# Patient Record
Sex: Female | Born: 1950
Health system: Southern US, Community
[De-identification: ages and names within clinical notes are randomized; demographics above are authoritative.]

## PROBLEM LIST (undated history)

## (undated) DIAGNOSIS — R570 Cardiogenic shock: Secondary | ICD-10-CM

## (undated) DIAGNOSIS — J45909 Unspecified asthma, uncomplicated: Secondary | ICD-10-CM

## (undated) DIAGNOSIS — R001 Bradycardia, unspecified: Secondary | ICD-10-CM

## (undated) DIAGNOSIS — K7200 Acute and subacute hepatic failure without coma: Secondary | ICD-10-CM

## (undated) DIAGNOSIS — K219 Gastro-esophageal reflux disease without esophagitis: Secondary | ICD-10-CM

## (undated) DIAGNOSIS — M479 Spondylosis, unspecified: Secondary | ICD-10-CM

## (undated) DIAGNOSIS — T8859XA Other complications of anesthesia, initial encounter: Secondary | ICD-10-CM

## (undated) DIAGNOSIS — I471 Supraventricular tachycardia: Secondary | ICD-10-CM

## (undated) DIAGNOSIS — E119 Type 2 diabetes mellitus without complications: Secondary | ICD-10-CM

## (undated) DIAGNOSIS — E559 Vitamin D deficiency, unspecified: Secondary | ICD-10-CM

## (undated) DIAGNOSIS — E785 Hyperlipidemia, unspecified: Secondary | ICD-10-CM

## (undated) DIAGNOSIS — I700 Atherosclerosis of aorta: Secondary | ICD-10-CM

## (undated) DIAGNOSIS — E874 Mixed disorder of acid-base balance: Secondary | ICD-10-CM

## (undated) DIAGNOSIS — J9601 Acute respiratory failure with hypoxia: Secondary | ICD-10-CM

## (undated) DIAGNOSIS — I10 Essential (primary) hypertension: Secondary | ICD-10-CM

## (undated) HISTORY — DX: Bradycardia, unspecified: R00.1

## (undated) HISTORY — DX: Acute and subacute hepatic failure without coma: K72.00

## (undated) HISTORY — DX: Vitamin D deficiency, unspecified: E55.9

## (undated) HISTORY — DX: Unspecified asthma, uncomplicated: J45.909

## (undated) HISTORY — PX: KNEE SURGERY: SHX244

## (undated) HISTORY — DX: Cardiogenic shock: R57.0

## (undated) HISTORY — DX: Mixed disorder of acid-base balance: E87.4

## (undated) HISTORY — DX: Atherosclerosis of aorta: I70.0

## (undated) HISTORY — PX: ABDOMINAL HYSTERECTOMY: SHX81

## (undated) HISTORY — DX: Gastro-esophageal reflux disease without esophagitis: K21.9

## (undated) HISTORY — DX: Type 2 diabetes mellitus without complications: E11.9

## (undated) HISTORY — DX: Hypercalcemia: E83.52

## (undated) HISTORY — DX: Acute respiratory failure with hypoxia: J96.01

## (undated) HISTORY — DX: Spondylosis, unspecified: M47.9

## (undated) HISTORY — DX: Hyperlipidemia, unspecified: E78.5

## (undated) HISTORY — DX: Supraventricular tachycardia: I47.1

## (undated) HISTORY — PX: INSERT / REPLACE / REMOVE PACEMAKER: SUR710

## (undated) HISTORY — DX: Essential (primary) hypertension: I10

---

## 2008-02-22 ENCOUNTER — Ambulatory Visit (HOSPITAL_BASED_OUTPATIENT_CLINIC_OR_DEPARTMENT_OTHER): Admission: RE | Admit: 2008-02-22 | Discharge: 2008-02-22 | Payer: Self-pay | Admitting: Internal Medicine

## 2008-02-29 ENCOUNTER — Ambulatory Visit: Payer: Self-pay | Admitting: Internal Medicine

## 2011-04-13 NOTE — Procedures (Signed)
NAME:  Cathy Walker, Cathy Walker               ACCOUNT NO.:  [id number]   MEDICAL RECORD NO.:  [id number]          PATIENT TYPE:  OUT   LOCATION:  SLEEP CENTER                 FACILITY:  [facility]   PHYSICIAN:  Clinton D. Maple Hudson, MD, FCCP, FACPDATE OF BIRTH:  Mar 30[birth year] 1949   DATE OF STUDY:                            NOCTURNAL POLYSOMNOGRAM   REFERRING PHYSICIAN:  Rayfield Citizen Prochnau   INDICATION FOR STUDY:  Hypersomnia with sleep apnea.   EPWORTH SLEEPINESS SCORE:  12/24.   BMI 37.8.  Weight 200 pounds.  Height 61 inches.  Neck 16 inches.   HOME MEDICATIONS:  Charted and reviewed.  Bedtime medications included  Carvedilol, metformin, Lopid, Cozaar, Tessalon.   SLEEP ARCHITECTURE:  Split-study protocol.  During the diagnostic phase,  total sleep time was 123.5 minutes with sleep efficiency 52%.  Stage I  was 24.7%.  Stage II 74.1%.  Stage III was absent.  REM was 1.2% of  total sleep time.  Sleep latency 46 minutes.  REM latency 190 minutes.  Awake after sleep onset 69 minutes.  Arousal index 38.4 which is  increased.   RESPIRATORY DATA:  Split-study protocol.  Apnea hypopnea index (AHI)  19.9 events per hour indicating moderate obstructive sleep  apnea/hypopnea syndrome for CPAP.  This included a total of 41 events  including 4 obstructive apneas and 37 hypopneas.  CPAP was titrated to  14 CWP, AHI 0 per hour.  She chose a medium Mirage Quattro mask with  heated humidifier.   OXYGEN DATA:  Very loud snoring before CPAP with oxygen desaturation to  a nadir of 89%.  After CPAP control, oxygen saturation held 94.9% on  room air.   CARDIAC DATA:  Normal sinus rhythm.   MOVEMENT-PARASOMNIA:  Periodic limb movement with arousal increased to  17 per hour during CPAP fitting.   IMPRESSIONS-RECOMMENDATIONS:  1. Moderate obstructive sleep apnea/hypopnea syndrome, AHI 19.9 per      hour including a small number of central apneas, very loud snoring      with oxygen desaturation to a nadir of 89%.  2.  Successful CPAP control at a pressure of 14 CWP.  She chose a      medium Mirage Quattro mask with heated humidifier.      Clinton D. Maple Hudson, MD, FCCP, FACP  Diplomate, Biomedical engineer of Sleep Medicine  Electronically Signed     CDY/MEDQ  D:  02/28/2008 13:47:05  T:  02/28/2008 14:55:02  Job:  [id number]

## 2013-09-16 ENCOUNTER — Encounter: Payer: Self-pay | Admitting: Internal Medicine

## 2013-11-06 ENCOUNTER — Ambulatory Visit: Payer: Self-pay | Admitting: Internal Medicine

## 2013-12-18 ENCOUNTER — Ambulatory Visit: Payer: Self-pay | Admitting: Internal Medicine

## 2014-08-11 DIAGNOSIS — Z79899 Other long term (current) drug therapy: Secondary | ICD-10-CM | POA: Diagnosis not present

## 2014-08-11 DIAGNOSIS — D649 Anemia, unspecified: Secondary | ICD-10-CM | POA: Diagnosis not present

## 2014-09-15 DIAGNOSIS — K219 Gastro-esophageal reflux disease without esophagitis: Secondary | ICD-10-CM | POA: Diagnosis not present

## 2014-09-15 DIAGNOSIS — E1165 Type 2 diabetes mellitus with hyperglycemia: Secondary | ICD-10-CM | POA: Diagnosis not present

## 2014-09-15 DIAGNOSIS — I10 Essential (primary) hypertension: Secondary | ICD-10-CM | POA: Diagnosis not present

## 2014-09-15 DIAGNOSIS — E785 Hyperlipidemia, unspecified: Secondary | ICD-10-CM | POA: Diagnosis not present

## 2014-09-15 DIAGNOSIS — Z79899 Other long term (current) drug therapy: Secondary | ICD-10-CM | POA: Diagnosis not present

## 2014-09-15 DIAGNOSIS — D649 Anemia, unspecified: Secondary | ICD-10-CM | POA: Diagnosis not present

## 2014-09-15 DIAGNOSIS — G43009 Migraine without aura, not intractable, without status migrainosus: Secondary | ICD-10-CM | POA: Diagnosis not present

## 2014-09-30 DIAGNOSIS — G43009 Migraine without aura, not intractable, without status migrainosus: Secondary | ICD-10-CM | POA: Diagnosis not present

## 2014-11-04 DIAGNOSIS — N952 Postmenopausal atrophic vaginitis: Secondary | ICD-10-CM | POA: Diagnosis not present

## 2014-11-04 DIAGNOSIS — B009 Herpesviral infection, unspecified: Secondary | ICD-10-CM | POA: Diagnosis not present

## 2014-12-31 DIAGNOSIS — D72819 Decreased white blood cell count, unspecified: Secondary | ICD-10-CM | POA: Diagnosis not present

## 2014-12-31 DIAGNOSIS — E785 Hyperlipidemia, unspecified: Secondary | ICD-10-CM | POA: Diagnosis not present

## 2014-12-31 DIAGNOSIS — I10 Essential (primary) hypertension: Secondary | ICD-10-CM | POA: Diagnosis not present

## 2014-12-31 DIAGNOSIS — E1165 Type 2 diabetes mellitus with hyperglycemia: Secondary | ICD-10-CM | POA: Diagnosis not present

## 2014-12-31 DIAGNOSIS — Z79899 Other long term (current) drug therapy: Secondary | ICD-10-CM | POA: Diagnosis not present

## 2015-01-31 DIAGNOSIS — N289 Disorder of kidney and ureter, unspecified: Secondary | ICD-10-CM | POA: Diagnosis not present

## 2015-03-07 DIAGNOSIS — D649 Anemia, unspecified: Secondary | ICD-10-CM | POA: Diagnosis not present

## 2015-03-07 DIAGNOSIS — R799 Abnormal finding of blood chemistry, unspecified: Secondary | ICD-10-CM | POA: Diagnosis not present

## 2015-05-06 DIAGNOSIS — E539 Vitamin B deficiency, unspecified: Secondary | ICD-10-CM | POA: Diagnosis not present

## 2015-05-06 DIAGNOSIS — E559 Vitamin D deficiency, unspecified: Secondary | ICD-10-CM | POA: Diagnosis not present

## 2015-05-06 DIAGNOSIS — J329 Chronic sinusitis, unspecified: Secondary | ICD-10-CM | POA: Diagnosis not present

## 2015-05-06 DIAGNOSIS — E785 Hyperlipidemia, unspecified: Secondary | ICD-10-CM | POA: Diagnosis not present

## 2015-05-06 DIAGNOSIS — J45909 Unspecified asthma, uncomplicated: Secondary | ICD-10-CM | POA: Diagnosis not present

## 2015-05-06 DIAGNOSIS — G43909 Migraine, unspecified, not intractable, without status migrainosus: Secondary | ICD-10-CM | POA: Diagnosis not present

## 2015-05-06 DIAGNOSIS — Z79899 Other long term (current) drug therapy: Secondary | ICD-10-CM | POA: Diagnosis not present

## 2015-05-06 DIAGNOSIS — E1165 Type 2 diabetes mellitus with hyperglycemia: Secondary | ICD-10-CM | POA: Diagnosis not present

## 2015-05-06 DIAGNOSIS — I10 Essential (primary) hypertension: Secondary | ICD-10-CM | POA: Diagnosis not present

## 2015-06-07 DIAGNOSIS — G43009 Migraine without aura, not intractable, without status migrainosus: Secondary | ICD-10-CM | POA: Diagnosis not present

## 2015-06-07 DIAGNOSIS — G603 Idiopathic progressive neuropathy: Secondary | ICD-10-CM | POA: Diagnosis not present

## 2015-06-07 DIAGNOSIS — G4733 Obstructive sleep apnea (adult) (pediatric): Secondary | ICD-10-CM | POA: Diagnosis not present

## 2015-06-07 DIAGNOSIS — G2581 Restless legs syndrome: Secondary | ICD-10-CM | POA: Diagnosis not present

## 2015-06-28 DIAGNOSIS — G43909 Migraine, unspecified, not intractable, without status migrainosus: Secondary | ICD-10-CM | POA: Diagnosis not present

## 2015-08-08 DIAGNOSIS — I10 Essential (primary) hypertension: Secondary | ICD-10-CM | POA: Diagnosis not present

## 2015-08-08 DIAGNOSIS — E785 Hyperlipidemia, unspecified: Secondary | ICD-10-CM | POA: Diagnosis not present

## 2015-08-08 DIAGNOSIS — J45909 Unspecified asthma, uncomplicated: Secondary | ICD-10-CM

## 2015-08-08 DIAGNOSIS — E559 Vitamin D deficiency, unspecified: Secondary | ICD-10-CM | POA: Diagnosis not present

## 2015-08-08 DIAGNOSIS — E1165 Type 2 diabetes mellitus with hyperglycemia: Secondary | ICD-10-CM | POA: Diagnosis not present

## 2015-08-08 DIAGNOSIS — R05 Cough: Secondary | ICD-10-CM | POA: Diagnosis not present

## 2015-08-08 DIAGNOSIS — Z79899 Other long term (current) drug therapy: Secondary | ICD-10-CM | POA: Diagnosis not present

## 2015-08-08 DIAGNOSIS — D649 Anemia, unspecified: Secondary | ICD-10-CM | POA: Diagnosis not present

## 2015-08-08 DIAGNOSIS — F332 Major depressive disorder, recurrent severe without psychotic features: Secondary | ICD-10-CM | POA: Diagnosis not present

## 2015-08-08 HISTORY — DX: Unspecified asthma, uncomplicated: J45.909

## 2015-10-05 DIAGNOSIS — G43009 Migraine without aura, not intractable, without status migrainosus: Secondary | ICD-10-CM | POA: Diagnosis not present

## 2015-10-05 DIAGNOSIS — E785 Hyperlipidemia, unspecified: Secondary | ICD-10-CM | POA: Diagnosis not present

## 2015-10-05 DIAGNOSIS — E1165 Type 2 diabetes mellitus with hyperglycemia: Secondary | ICD-10-CM | POA: Diagnosis not present

## 2015-10-05 DIAGNOSIS — I10 Essential (primary) hypertension: Secondary | ICD-10-CM | POA: Diagnosis not present

## 2016-01-09 DIAGNOSIS — R05 Cough: Secondary | ICD-10-CM | POA: Diagnosis not present

## 2016-01-09 DIAGNOSIS — E1165 Type 2 diabetes mellitus with hyperglycemia: Secondary | ICD-10-CM | POA: Diagnosis not present

## 2016-01-09 DIAGNOSIS — Z79899 Other long term (current) drug therapy: Secondary | ICD-10-CM | POA: Diagnosis not present

## 2016-01-09 DIAGNOSIS — J45909 Unspecified asthma, uncomplicated: Secondary | ICD-10-CM | POA: Diagnosis not present

## 2016-01-09 DIAGNOSIS — E785 Hyperlipidemia, unspecified: Secondary | ICD-10-CM | POA: Diagnosis not present

## 2016-01-09 DIAGNOSIS — G43009 Migraine without aura, not intractable, without status migrainosus: Secondary | ICD-10-CM | POA: Diagnosis not present

## 2016-01-09 DIAGNOSIS — I10 Essential (primary) hypertension: Secondary | ICD-10-CM | POA: Diagnosis not present

## 2016-01-11 DIAGNOSIS — E1165 Type 2 diabetes mellitus with hyperglycemia: Secondary | ICD-10-CM | POA: Diagnosis not present

## 2016-05-09 DIAGNOSIS — E559 Vitamin D deficiency, unspecified: Secondary | ICD-10-CM | POA: Diagnosis not present

## 2016-05-09 DIAGNOSIS — M79644 Pain in right finger(s): Secondary | ICD-10-CM | POA: Diagnosis not present

## 2016-05-09 DIAGNOSIS — E785 Hyperlipidemia, unspecified: Secondary | ICD-10-CM | POA: Diagnosis not present

## 2016-05-09 DIAGNOSIS — J45909 Unspecified asthma, uncomplicated: Secondary | ICD-10-CM | POA: Diagnosis not present

## 2016-05-09 DIAGNOSIS — Z79899 Other long term (current) drug therapy: Secondary | ICD-10-CM | POA: Diagnosis not present

## 2016-05-09 DIAGNOSIS — I10 Essential (primary) hypertension: Secondary | ICD-10-CM | POA: Diagnosis not present

## 2016-05-09 DIAGNOSIS — E1165 Type 2 diabetes mellitus with hyperglycemia: Secondary | ICD-10-CM | POA: Diagnosis not present

## 2016-05-09 HISTORY — DX: Vitamin D deficiency, unspecified: E55.9

## 2016-05-24 DIAGNOSIS — M79644 Pain in right finger(s): Secondary | ICD-10-CM | POA: Diagnosis not present

## 2016-07-02 DIAGNOSIS — Z1389 Encounter for screening for other disorder: Secondary | ICD-10-CM | POA: Diagnosis not present

## 2016-07-02 DIAGNOSIS — Z0000 Encounter for general adult medical examination without abnormal findings: Secondary | ICD-10-CM | POA: Diagnosis not present

## 2016-07-02 DIAGNOSIS — Z1231 Encounter for screening mammogram for malignant neoplasm of breast: Secondary | ICD-10-CM | POA: Diagnosis not present

## 2016-07-03 DIAGNOSIS — H524 Presbyopia: Secondary | ICD-10-CM | POA: Diagnosis not present

## 2016-07-03 DIAGNOSIS — H04552 Acquired stenosis of left nasolacrimal duct: Secondary | ICD-10-CM | POA: Diagnosis not present

## 2016-07-03 DIAGNOSIS — H2513 Age-related nuclear cataract, bilateral: Secondary | ICD-10-CM | POA: Diagnosis not present

## 2016-08-14 DIAGNOSIS — R1032 Left lower quadrant pain: Secondary | ICD-10-CM | POA: Diagnosis not present

## 2016-08-14 DIAGNOSIS — R062 Wheezing: Secondary | ICD-10-CM | POA: Diagnosis not present

## 2016-08-14 DIAGNOSIS — J40 Bronchitis, not specified as acute or chronic: Secondary | ICD-10-CM | POA: Diagnosis not present

## 2016-08-14 DIAGNOSIS — R05 Cough: Secondary | ICD-10-CM | POA: Diagnosis not present

## 2016-09-25 DIAGNOSIS — Z79899 Other long term (current) drug therapy: Secondary | ICD-10-CM | POA: Diagnosis not present

## 2016-09-25 DIAGNOSIS — G43009 Migraine without aura, not intractable, without status migrainosus: Secondary | ICD-10-CM | POA: Diagnosis not present

## 2016-09-25 DIAGNOSIS — E785 Hyperlipidemia, unspecified: Secondary | ICD-10-CM | POA: Diagnosis not present

## 2016-09-25 DIAGNOSIS — E119 Type 2 diabetes mellitus without complications: Secondary | ICD-10-CM | POA: Diagnosis not present

## 2016-09-25 DIAGNOSIS — J45909 Unspecified asthma, uncomplicated: Secondary | ICD-10-CM | POA: Diagnosis not present

## 2016-09-25 DIAGNOSIS — E559 Vitamin D deficiency, unspecified: Secondary | ICD-10-CM | POA: Diagnosis not present

## 2016-09-25 DIAGNOSIS — I10 Essential (primary) hypertension: Secondary | ICD-10-CM | POA: Diagnosis not present

## 2016-09-25 HISTORY — DX: Hypercalcemia: E83.52

## 2016-10-08 DIAGNOSIS — R1032 Left lower quadrant pain: Secondary | ICD-10-CM | POA: Diagnosis not present

## 2016-10-08 DIAGNOSIS — K5730 Diverticulosis of large intestine without perforation or abscess without bleeding: Secondary | ICD-10-CM | POA: Diagnosis not present

## 2016-10-08 DIAGNOSIS — K5732 Diverticulitis of large intestine without perforation or abscess without bleeding: Secondary | ICD-10-CM | POA: Diagnosis not present

## 2016-12-25 DIAGNOSIS — Z23 Encounter for immunization: Secondary | ICD-10-CM | POA: Diagnosis not present

## 2017-01-24 DIAGNOSIS — M25561 Pain in right knee: Secondary | ICD-10-CM | POA: Diagnosis not present

## 2017-01-24 DIAGNOSIS — J45909 Unspecified asthma, uncomplicated: Secondary | ICD-10-CM | POA: Diagnosis not present

## 2017-01-24 DIAGNOSIS — E785 Hyperlipidemia, unspecified: Secondary | ICD-10-CM | POA: Diagnosis not present

## 2017-01-24 DIAGNOSIS — I10 Essential (primary) hypertension: Secondary | ICD-10-CM | POA: Diagnosis not present

## 2017-01-24 DIAGNOSIS — Z79899 Other long term (current) drug therapy: Secondary | ICD-10-CM | POA: Diagnosis not present

## 2017-01-24 DIAGNOSIS — E119 Type 2 diabetes mellitus without complications: Secondary | ICD-10-CM | POA: Diagnosis not present

## 2017-01-24 DIAGNOSIS — M1810 Unilateral primary osteoarthritis of first carpometacarpal joint, unspecified hand: Secondary | ICD-10-CM | POA: Diagnosis not present

## 2017-01-24 DIAGNOSIS — G43009 Migraine without aura, not intractable, without status migrainosus: Secondary | ICD-10-CM | POA: Diagnosis not present

## 2017-01-24 DIAGNOSIS — E559 Vitamin D deficiency, unspecified: Secondary | ICD-10-CM | POA: Diagnosis not present

## 2017-02-21 DIAGNOSIS — M545 Low back pain: Secondary | ICD-10-CM | POA: Diagnosis not present

## 2017-02-21 DIAGNOSIS — M546 Pain in thoracic spine: Secondary | ICD-10-CM | POA: Diagnosis not present

## 2017-02-21 DIAGNOSIS — M479 Spondylosis, unspecified: Secondary | ICD-10-CM

## 2017-02-21 DIAGNOSIS — M47819 Spondylosis without myelopathy or radiculopathy, site unspecified: Secondary | ICD-10-CM | POA: Diagnosis not present

## 2017-02-21 DIAGNOSIS — M25552 Pain in left hip: Secondary | ICD-10-CM | POA: Diagnosis not present

## 2017-02-21 DIAGNOSIS — R1032 Left lower quadrant pain: Secondary | ICD-10-CM | POA: Diagnosis not present

## 2017-02-21 HISTORY — DX: Spondylosis, unspecified: M47.9

## 2017-02-26 DIAGNOSIS — I700 Atherosclerosis of aorta: Secondary | ICD-10-CM

## 2017-02-26 HISTORY — DX: Atherosclerosis of aorta: I70.0

## 2017-03-02 DIAGNOSIS — M160 Bilateral primary osteoarthritis of hip: Secondary | ICD-10-CM | POA: Diagnosis not present

## 2017-03-02 DIAGNOSIS — M87052 Idiopathic aseptic necrosis of left femur: Secondary | ICD-10-CM | POA: Diagnosis not present

## 2017-03-06 DIAGNOSIS — I714 Abdominal aortic aneurysm, without rupture: Secondary | ICD-10-CM | POA: Diagnosis not present

## 2017-03-14 DIAGNOSIS — M479 Spondylosis, unspecified: Secondary | ICD-10-CM | POA: Diagnosis not present

## 2017-03-14 DIAGNOSIS — I700 Atherosclerosis of aorta: Secondary | ICD-10-CM | POA: Diagnosis not present

## 2017-03-14 DIAGNOSIS — K5730 Diverticulosis of large intestine without perforation or abscess without bleeding: Secondary | ICD-10-CM | POA: Diagnosis not present

## 2017-03-14 DIAGNOSIS — I77811 Abdominal aortic ectasia: Secondary | ICD-10-CM | POA: Diagnosis not present

## 2017-03-15 DIAGNOSIS — R591 Generalized enlarged lymph nodes: Secondary | ICD-10-CM | POA: Diagnosis not present

## 2017-03-15 DIAGNOSIS — E210 Primary hyperparathyroidism: Secondary | ICD-10-CM | POA: Diagnosis not present

## 2017-03-15 DIAGNOSIS — Z13820 Encounter for screening for osteoporosis: Secondary | ICD-10-CM | POA: Diagnosis not present

## 2017-03-21 DIAGNOSIS — E210 Primary hyperparathyroidism: Secondary | ICD-10-CM | POA: Diagnosis not present

## 2017-03-21 DIAGNOSIS — E042 Nontoxic multinodular goiter: Secondary | ICD-10-CM | POA: Diagnosis not present

## 2017-03-21 DIAGNOSIS — E213 Hyperparathyroidism, unspecified: Secondary | ICD-10-CM | POA: Diagnosis not present

## 2017-03-21 DIAGNOSIS — R591 Generalized enlarged lymph nodes: Secondary | ICD-10-CM | POA: Diagnosis not present

## 2017-03-21 DIAGNOSIS — E041 Nontoxic single thyroid nodule: Secondary | ICD-10-CM | POA: Diagnosis not present

## 2017-04-05 DIAGNOSIS — R790 Abnormal level of blood mineral: Secondary | ICD-10-CM | POA: Diagnosis not present

## 2017-04-30 DIAGNOSIS — Z13820 Encounter for screening for osteoporosis: Secondary | ICD-10-CM | POA: Diagnosis not present

## 2017-04-30 DIAGNOSIS — E210 Primary hyperparathyroidism: Secondary | ICD-10-CM | POA: Diagnosis not present

## 2017-05-13 DIAGNOSIS — M8588 Other specified disorders of bone density and structure, other site: Secondary | ICD-10-CM | POA: Diagnosis not present

## 2017-05-13 DIAGNOSIS — M85851 Other specified disorders of bone density and structure, right thigh: Secondary | ICD-10-CM | POA: Diagnosis not present

## 2017-05-28 DIAGNOSIS — J45909 Unspecified asthma, uncomplicated: Secondary | ICD-10-CM | POA: Diagnosis not present

## 2017-05-28 DIAGNOSIS — G43009 Migraine without aura, not intractable, without status migrainosus: Secondary | ICD-10-CM | POA: Diagnosis not present

## 2017-05-28 DIAGNOSIS — R790 Abnormal level of blood mineral: Secondary | ICD-10-CM | POA: Diagnosis not present

## 2017-05-28 DIAGNOSIS — R002 Palpitations: Secondary | ICD-10-CM | POA: Diagnosis not present

## 2017-05-28 DIAGNOSIS — E119 Type 2 diabetes mellitus without complications: Secondary | ICD-10-CM | POA: Diagnosis not present

## 2017-05-28 DIAGNOSIS — I10 Essential (primary) hypertension: Secondary | ICD-10-CM | POA: Diagnosis not present

## 2017-05-28 DIAGNOSIS — R0981 Nasal congestion: Secondary | ICD-10-CM | POA: Diagnosis not present

## 2017-05-28 DIAGNOSIS — E785 Hyperlipidemia, unspecified: Secondary | ICD-10-CM | POA: Diagnosis not present

## 2017-06-28 DIAGNOSIS — M25562 Pain in left knee: Secondary | ICD-10-CM | POA: Diagnosis not present

## 2017-06-28 DIAGNOSIS — F419 Anxiety disorder, unspecified: Secondary | ICD-10-CM | POA: Diagnosis not present

## 2017-06-28 DIAGNOSIS — I10 Essential (primary) hypertension: Secondary | ICD-10-CM | POA: Diagnosis not present

## 2017-06-28 DIAGNOSIS — J45909 Unspecified asthma, uncomplicated: Secondary | ICD-10-CM | POA: Diagnosis not present

## 2017-06-28 DIAGNOSIS — E1165 Type 2 diabetes mellitus with hyperglycemia: Secondary | ICD-10-CM | POA: Diagnosis not present

## 2017-06-28 DIAGNOSIS — G43009 Migraine without aura, not intractable, without status migrainosus: Secondary | ICD-10-CM | POA: Diagnosis not present

## 2017-06-28 DIAGNOSIS — E785 Hyperlipidemia, unspecified: Secondary | ICD-10-CM | POA: Diagnosis not present

## 2017-06-28 DIAGNOSIS — M47819 Spondylosis without myelopathy or radiculopathy, site unspecified: Secondary | ICD-10-CM | POA: Diagnosis not present

## 2017-07-01 DIAGNOSIS — M25562 Pain in left knee: Secondary | ICD-10-CM | POA: Diagnosis not present

## 2017-07-22 DIAGNOSIS — M25561 Pain in right knee: Secondary | ICD-10-CM | POA: Diagnosis not present

## 2017-07-22 DIAGNOSIS — S83412A Sprain of medial collateral ligament of left knee, initial encounter: Secondary | ICD-10-CM | POA: Diagnosis not present

## 2017-07-22 DIAGNOSIS — M25562 Pain in left knee: Secondary | ICD-10-CM | POA: Diagnosis not present

## 2017-08-15 DIAGNOSIS — M25462 Effusion, left knee: Secondary | ICD-10-CM | POA: Diagnosis not present

## 2017-08-15 DIAGNOSIS — X58XXXA Exposure to other specified factors, initial encounter: Secondary | ICD-10-CM | POA: Diagnosis not present

## 2017-08-15 DIAGNOSIS — M660 Rupture of popliteal cyst: Secondary | ICD-10-CM | POA: Diagnosis not present

## 2017-08-15 DIAGNOSIS — S83282A Other tear of lateral meniscus, current injury, left knee, initial encounter: Secondary | ICD-10-CM | POA: Diagnosis not present

## 2017-08-15 DIAGNOSIS — S83242A Other tear of medial meniscus, current injury, left knee, initial encounter: Secondary | ICD-10-CM | POA: Diagnosis not present

## 2017-08-15 DIAGNOSIS — M25562 Pain in left knee: Secondary | ICD-10-CM | POA: Diagnosis not present

## 2017-08-19 DIAGNOSIS — M1712 Unilateral primary osteoarthritis, left knee: Secondary | ICD-10-CM | POA: Diagnosis not present

## 2017-08-19 DIAGNOSIS — G8929 Other chronic pain: Secondary | ICD-10-CM | POA: Diagnosis not present

## 2017-08-19 DIAGNOSIS — M23301 Other meniscus derangements, unspecified lateral meniscus, left knee: Secondary | ICD-10-CM | POA: Diagnosis not present

## 2017-08-19 DIAGNOSIS — M23322 Other meniscus derangements, posterior horn of medial meniscus, left knee: Secondary | ICD-10-CM | POA: Diagnosis not present

## 2017-08-19 DIAGNOSIS — M25562 Pain in left knee: Secondary | ICD-10-CM | POA: Diagnosis not present

## 2017-08-20 DIAGNOSIS — Z79899 Other long term (current) drug therapy: Secondary | ICD-10-CM | POA: Diagnosis not present

## 2017-08-20 DIAGNOSIS — E785 Hyperlipidemia, unspecified: Secondary | ICD-10-CM | POA: Diagnosis not present

## 2017-08-20 DIAGNOSIS — E1165 Type 2 diabetes mellitus with hyperglycemia: Secondary | ICD-10-CM | POA: Diagnosis not present

## 2017-08-20 DIAGNOSIS — I10 Essential (primary) hypertension: Secondary | ICD-10-CM | POA: Diagnosis not present

## 2017-08-30 DIAGNOSIS — M23301 Other meniscus derangements, unspecified lateral meniscus, left knee: Secondary | ICD-10-CM | POA: Diagnosis not present

## 2017-08-30 DIAGNOSIS — G4730 Sleep apnea, unspecified: Secondary | ICD-10-CM | POA: Diagnosis not present

## 2017-08-30 DIAGNOSIS — M659 Synovitis and tenosynovitis, unspecified: Secondary | ICD-10-CM | POA: Diagnosis not present

## 2017-08-30 DIAGNOSIS — K219 Gastro-esophageal reflux disease without esophagitis: Secondary | ICD-10-CM | POA: Diagnosis not present

## 2017-08-30 DIAGNOSIS — M23322 Other meniscus derangements, posterior horn of medial meniscus, left knee: Secondary | ICD-10-CM | POA: Diagnosis not present

## 2017-08-30 DIAGNOSIS — M1712 Unilateral primary osteoarthritis, left knee: Secondary | ICD-10-CM | POA: Diagnosis not present

## 2017-08-30 DIAGNOSIS — I10 Essential (primary) hypertension: Secondary | ICD-10-CM | POA: Diagnosis not present

## 2017-08-30 DIAGNOSIS — H9190 Unspecified hearing loss, unspecified ear: Secondary | ICD-10-CM | POA: Diagnosis not present

## 2017-08-30 DIAGNOSIS — E785 Hyperlipidemia, unspecified: Secondary | ICD-10-CM | POA: Diagnosis not present

## 2017-08-30 DIAGNOSIS — Z87891 Personal history of nicotine dependence: Secondary | ICD-10-CM | POA: Diagnosis not present

## 2017-08-30 DIAGNOSIS — J45909 Unspecified asthma, uncomplicated: Secondary | ICD-10-CM | POA: Diagnosis not present

## 2017-08-30 DIAGNOSIS — G8929 Other chronic pain: Secondary | ICD-10-CM | POA: Diagnosis not present

## 2017-08-30 DIAGNOSIS — Z9989 Dependence on other enabling machines and devices: Secondary | ICD-10-CM | POA: Diagnosis not present

## 2017-08-30 DIAGNOSIS — M23352 Other meniscus derangements, posterior horn of lateral meniscus, left knee: Secondary | ICD-10-CM | POA: Diagnosis not present

## 2017-08-30 DIAGNOSIS — Z7984 Long term (current) use of oral hypoglycemic drugs: Secondary | ICD-10-CM | POA: Diagnosis not present

## 2017-08-30 DIAGNOSIS — E119 Type 2 diabetes mellitus without complications: Secondary | ICD-10-CM | POA: Diagnosis not present

## 2017-08-30 DIAGNOSIS — Z79899 Other long term (current) drug therapy: Secondary | ICD-10-CM | POA: Diagnosis not present

## 2017-09-02 DIAGNOSIS — R2689 Other abnormalities of gait and mobility: Secondary | ICD-10-CM | POA: Diagnosis not present

## 2017-09-02 DIAGNOSIS — M1712 Unilateral primary osteoarthritis, left knee: Secondary | ICD-10-CM | POA: Diagnosis not present

## 2017-09-02 DIAGNOSIS — M23301 Other meniscus derangements, unspecified lateral meniscus, left knee: Secondary | ICD-10-CM | POA: Diagnosis not present

## 2017-09-02 DIAGNOSIS — M23322 Other meniscus derangements, posterior horn of medial meniscus, left knee: Secondary | ICD-10-CM | POA: Diagnosis not present

## 2017-09-02 DIAGNOSIS — M6281 Muscle weakness (generalized): Secondary | ICD-10-CM | POA: Diagnosis not present

## 2017-09-02 DIAGNOSIS — M25562 Pain in left knee: Secondary | ICD-10-CM | POA: Diagnosis not present

## 2017-09-04 DIAGNOSIS — M25562 Pain in left knee: Secondary | ICD-10-CM | POA: Diagnosis not present

## 2017-09-04 DIAGNOSIS — M6281 Muscle weakness (generalized): Secondary | ICD-10-CM | POA: Diagnosis not present

## 2017-09-04 DIAGNOSIS — M23322 Other meniscus derangements, posterior horn of medial meniscus, left knee: Secondary | ICD-10-CM | POA: Diagnosis not present

## 2017-09-04 DIAGNOSIS — M23301 Other meniscus derangements, unspecified lateral meniscus, left knee: Secondary | ICD-10-CM | POA: Diagnosis not present

## 2017-09-04 DIAGNOSIS — M1712 Unilateral primary osteoarthritis, left knee: Secondary | ICD-10-CM | POA: Diagnosis not present

## 2017-09-04 DIAGNOSIS — R2689 Other abnormalities of gait and mobility: Secondary | ICD-10-CM | POA: Diagnosis not present

## 2017-09-09 DIAGNOSIS — M25562 Pain in left knee: Secondary | ICD-10-CM | POA: Diagnosis not present

## 2017-09-09 DIAGNOSIS — M6281 Muscle weakness (generalized): Secondary | ICD-10-CM | POA: Diagnosis not present

## 2017-09-09 DIAGNOSIS — R2689 Other abnormalities of gait and mobility: Secondary | ICD-10-CM | POA: Diagnosis not present

## 2017-09-09 DIAGNOSIS — M23301 Other meniscus derangements, unspecified lateral meniscus, left knee: Secondary | ICD-10-CM | POA: Diagnosis not present

## 2017-09-09 DIAGNOSIS — M1712 Unilateral primary osteoarthritis, left knee: Secondary | ICD-10-CM | POA: Diagnosis not present

## 2017-09-09 DIAGNOSIS — M23322 Other meniscus derangements, posterior horn of medial meniscus, left knee: Secondary | ICD-10-CM | POA: Diagnosis not present

## 2017-09-11 DIAGNOSIS — M23301 Other meniscus derangements, unspecified lateral meniscus, left knee: Secondary | ICD-10-CM | POA: Diagnosis not present

## 2017-09-11 DIAGNOSIS — R2689 Other abnormalities of gait and mobility: Secondary | ICD-10-CM | POA: Diagnosis not present

## 2017-09-11 DIAGNOSIS — M25562 Pain in left knee: Secondary | ICD-10-CM | POA: Diagnosis not present

## 2017-09-11 DIAGNOSIS — M1712 Unilateral primary osteoarthritis, left knee: Secondary | ICD-10-CM | POA: Diagnosis not present

## 2017-09-11 DIAGNOSIS — M6281 Muscle weakness (generalized): Secondary | ICD-10-CM | POA: Diagnosis not present

## 2017-09-11 DIAGNOSIS — M23322 Other meniscus derangements, posterior horn of medial meniscus, left knee: Secondary | ICD-10-CM | POA: Diagnosis not present

## 2017-09-16 DIAGNOSIS — R2689 Other abnormalities of gait and mobility: Secondary | ICD-10-CM | POA: Diagnosis not present

## 2017-09-16 DIAGNOSIS — M23301 Other meniscus derangements, unspecified lateral meniscus, left knee: Secondary | ICD-10-CM | POA: Diagnosis not present

## 2017-09-16 DIAGNOSIS — M23322 Other meniscus derangements, posterior horn of medial meniscus, left knee: Secondary | ICD-10-CM | POA: Diagnosis not present

## 2017-09-16 DIAGNOSIS — M1712 Unilateral primary osteoarthritis, left knee: Secondary | ICD-10-CM | POA: Diagnosis not present

## 2017-09-16 DIAGNOSIS — M25562 Pain in left knee: Secondary | ICD-10-CM | POA: Diagnosis not present

## 2017-09-16 DIAGNOSIS — M6281 Muscle weakness (generalized): Secondary | ICD-10-CM | POA: Diagnosis not present

## 2017-09-18 DIAGNOSIS — M1712 Unilateral primary osteoarthritis, left knee: Secondary | ICD-10-CM | POA: Diagnosis not present

## 2017-09-18 DIAGNOSIS — M23301 Other meniscus derangements, unspecified lateral meniscus, left knee: Secondary | ICD-10-CM | POA: Diagnosis not present

## 2017-09-18 DIAGNOSIS — M25562 Pain in left knee: Secondary | ICD-10-CM | POA: Diagnosis not present

## 2017-09-18 DIAGNOSIS — R2689 Other abnormalities of gait and mobility: Secondary | ICD-10-CM | POA: Diagnosis not present

## 2017-09-18 DIAGNOSIS — M23322 Other meniscus derangements, posterior horn of medial meniscus, left knee: Secondary | ICD-10-CM | POA: Diagnosis not present

## 2017-09-18 DIAGNOSIS — M6281 Muscle weakness (generalized): Secondary | ICD-10-CM | POA: Diagnosis not present

## 2017-09-23 DIAGNOSIS — M23301 Other meniscus derangements, unspecified lateral meniscus, left knee: Secondary | ICD-10-CM | POA: Diagnosis not present

## 2017-09-23 DIAGNOSIS — M1712 Unilateral primary osteoarthritis, left knee: Secondary | ICD-10-CM | POA: Diagnosis not present

## 2017-09-23 DIAGNOSIS — M6281 Muscle weakness (generalized): Secondary | ICD-10-CM | POA: Diagnosis not present

## 2017-09-23 DIAGNOSIS — M25562 Pain in left knee: Secondary | ICD-10-CM | POA: Diagnosis not present

## 2017-09-23 DIAGNOSIS — M23322 Other meniscus derangements, posterior horn of medial meniscus, left knee: Secondary | ICD-10-CM | POA: Diagnosis not present

## 2017-09-23 DIAGNOSIS — R2689 Other abnormalities of gait and mobility: Secondary | ICD-10-CM | POA: Diagnosis not present

## 2017-09-25 DIAGNOSIS — M25562 Pain in left knee: Secondary | ICD-10-CM | POA: Diagnosis not present

## 2017-09-25 DIAGNOSIS — R2689 Other abnormalities of gait and mobility: Secondary | ICD-10-CM | POA: Diagnosis not present

## 2017-09-25 DIAGNOSIS — M1712 Unilateral primary osteoarthritis, left knee: Secondary | ICD-10-CM | POA: Diagnosis not present

## 2017-09-25 DIAGNOSIS — M6281 Muscle weakness (generalized): Secondary | ICD-10-CM | POA: Diagnosis not present

## 2017-09-25 DIAGNOSIS — M23322 Other meniscus derangements, posterior horn of medial meniscus, left knee: Secondary | ICD-10-CM | POA: Diagnosis not present

## 2017-09-25 DIAGNOSIS — M23301 Other meniscus derangements, unspecified lateral meniscus, left knee: Secondary | ICD-10-CM | POA: Diagnosis not present

## 2017-10-01 DIAGNOSIS — M1712 Unilateral primary osteoarthritis, left knee: Secondary | ICD-10-CM | POA: Diagnosis not present

## 2017-10-01 DIAGNOSIS — M23301 Other meniscus derangements, unspecified lateral meniscus, left knee: Secondary | ICD-10-CM | POA: Diagnosis not present

## 2017-10-01 DIAGNOSIS — E119 Type 2 diabetes mellitus without complications: Secondary | ICD-10-CM | POA: Diagnosis not present

## 2017-10-01 DIAGNOSIS — E559 Vitamin D deficiency, unspecified: Secondary | ICD-10-CM | POA: Diagnosis not present

## 2017-10-01 DIAGNOSIS — M23322 Other meniscus derangements, posterior horn of medial meniscus, left knee: Secondary | ICD-10-CM | POA: Diagnosis not present

## 2017-10-01 DIAGNOSIS — M25562 Pain in left knee: Secondary | ICD-10-CM | POA: Diagnosis not present

## 2017-10-01 DIAGNOSIS — I10 Essential (primary) hypertension: Secondary | ICD-10-CM | POA: Diagnosis not present

## 2017-10-01 DIAGNOSIS — M7989 Other specified soft tissue disorders: Secondary | ICD-10-CM | POA: Diagnosis not present

## 2017-10-01 DIAGNOSIS — Z79899 Other long term (current) drug therapy: Secondary | ICD-10-CM | POA: Diagnosis not present

## 2017-10-01 DIAGNOSIS — E785 Hyperlipidemia, unspecified: Secondary | ICD-10-CM | POA: Diagnosis not present

## 2017-10-01 DIAGNOSIS — R2689 Other abnormalities of gait and mobility: Secondary | ICD-10-CM | POA: Diagnosis not present

## 2017-10-01 DIAGNOSIS — M6281 Muscle weakness (generalized): Secondary | ICD-10-CM | POA: Diagnosis not present

## 2017-10-07 DIAGNOSIS — M1712 Unilateral primary osteoarthritis, left knee: Secondary | ICD-10-CM | POA: Diagnosis not present

## 2017-10-07 DIAGNOSIS — M23322 Other meniscus derangements, posterior horn of medial meniscus, left knee: Secondary | ICD-10-CM | POA: Diagnosis not present

## 2017-10-07 DIAGNOSIS — M23301 Other meniscus derangements, unspecified lateral meniscus, left knee: Secondary | ICD-10-CM | POA: Diagnosis not present

## 2017-10-07 DIAGNOSIS — M25562 Pain in left knee: Secondary | ICD-10-CM | POA: Diagnosis not present

## 2017-10-07 DIAGNOSIS — R2689 Other abnormalities of gait and mobility: Secondary | ICD-10-CM | POA: Diagnosis not present

## 2017-10-07 DIAGNOSIS — M6281 Muscle weakness (generalized): Secondary | ICD-10-CM | POA: Diagnosis not present

## 2017-10-09 DIAGNOSIS — M25562 Pain in left knee: Secondary | ICD-10-CM | POA: Diagnosis not present

## 2017-10-09 DIAGNOSIS — M6281 Muscle weakness (generalized): Secondary | ICD-10-CM | POA: Diagnosis not present

## 2017-10-09 DIAGNOSIS — M23301 Other meniscus derangements, unspecified lateral meniscus, left knee: Secondary | ICD-10-CM | POA: Diagnosis not present

## 2017-10-09 DIAGNOSIS — R2689 Other abnormalities of gait and mobility: Secondary | ICD-10-CM | POA: Diagnosis not present

## 2017-10-09 DIAGNOSIS — M23322 Other meniscus derangements, posterior horn of medial meniscus, left knee: Secondary | ICD-10-CM | POA: Diagnosis not present

## 2017-10-09 DIAGNOSIS — M1712 Unilateral primary osteoarthritis, left knee: Secondary | ICD-10-CM | POA: Diagnosis not present

## 2017-10-14 DIAGNOSIS — M23301 Other meniscus derangements, unspecified lateral meniscus, left knee: Secondary | ICD-10-CM | POA: Diagnosis not present

## 2017-10-14 DIAGNOSIS — M25562 Pain in left knee: Secondary | ICD-10-CM | POA: Diagnosis not present

## 2017-10-14 DIAGNOSIS — M23322 Other meniscus derangements, posterior horn of medial meniscus, left knee: Secondary | ICD-10-CM | POA: Diagnosis not present

## 2017-10-14 DIAGNOSIS — M1712 Unilateral primary osteoarthritis, left knee: Secondary | ICD-10-CM | POA: Diagnosis not present

## 2017-10-14 DIAGNOSIS — R2689 Other abnormalities of gait and mobility: Secondary | ICD-10-CM | POA: Diagnosis not present

## 2017-10-14 DIAGNOSIS — M6281 Muscle weakness (generalized): Secondary | ICD-10-CM | POA: Diagnosis not present

## 2017-10-24 DIAGNOSIS — M23322 Other meniscus derangements, posterior horn of medial meniscus, left knee: Secondary | ICD-10-CM | POA: Diagnosis not present

## 2017-10-24 DIAGNOSIS — M1712 Unilateral primary osteoarthritis, left knee: Secondary | ICD-10-CM | POA: Diagnosis not present

## 2017-10-24 DIAGNOSIS — R2689 Other abnormalities of gait and mobility: Secondary | ICD-10-CM | POA: Diagnosis not present

## 2017-10-24 DIAGNOSIS — M23301 Other meniscus derangements, unspecified lateral meniscus, left knee: Secondary | ICD-10-CM | POA: Diagnosis not present

## 2017-10-24 DIAGNOSIS — M6281 Muscle weakness (generalized): Secondary | ICD-10-CM | POA: Diagnosis not present

## 2017-10-24 DIAGNOSIS — M25562 Pain in left knee: Secondary | ICD-10-CM | POA: Diagnosis not present

## 2017-10-27 DIAGNOSIS — Z23 Encounter for immunization: Secondary | ICD-10-CM | POA: Diagnosis not present

## 2017-10-28 DIAGNOSIS — M23301 Other meniscus derangements, unspecified lateral meniscus, left knee: Secondary | ICD-10-CM | POA: Diagnosis not present

## 2017-10-28 DIAGNOSIS — M6281 Muscle weakness (generalized): Secondary | ICD-10-CM | POA: Diagnosis not present

## 2017-10-28 DIAGNOSIS — M23322 Other meniscus derangements, posterior horn of medial meniscus, left knee: Secondary | ICD-10-CM | POA: Diagnosis not present

## 2017-10-28 DIAGNOSIS — M25562 Pain in left knee: Secondary | ICD-10-CM | POA: Diagnosis not present

## 2017-10-28 DIAGNOSIS — M1712 Unilateral primary osteoarthritis, left knee: Secondary | ICD-10-CM | POA: Diagnosis not present

## 2017-10-28 DIAGNOSIS — R2689 Other abnormalities of gait and mobility: Secondary | ICD-10-CM | POA: Diagnosis not present

## 2017-10-30 DIAGNOSIS — R2689 Other abnormalities of gait and mobility: Secondary | ICD-10-CM | POA: Diagnosis not present

## 2017-10-30 DIAGNOSIS — M25562 Pain in left knee: Secondary | ICD-10-CM | POA: Diagnosis not present

## 2017-10-30 DIAGNOSIS — M6281 Muscle weakness (generalized): Secondary | ICD-10-CM | POA: Diagnosis not present

## 2017-10-30 DIAGNOSIS — M23322 Other meniscus derangements, posterior horn of medial meniscus, left knee: Secondary | ICD-10-CM | POA: Diagnosis not present

## 2017-10-30 DIAGNOSIS — M23301 Other meniscus derangements, unspecified lateral meniscus, left knee: Secondary | ICD-10-CM | POA: Diagnosis not present

## 2017-10-30 DIAGNOSIS — M1712 Unilateral primary osteoarthritis, left knee: Secondary | ICD-10-CM | POA: Diagnosis not present

## 2017-11-06 DIAGNOSIS — M23301 Other meniscus derangements, unspecified lateral meniscus, left knee: Secondary | ICD-10-CM | POA: Diagnosis not present

## 2017-11-06 DIAGNOSIS — M1712 Unilateral primary osteoarthritis, left knee: Secondary | ICD-10-CM | POA: Diagnosis not present

## 2017-11-06 DIAGNOSIS — M6281 Muscle weakness (generalized): Secondary | ICD-10-CM | POA: Diagnosis not present

## 2017-11-06 DIAGNOSIS — M25562 Pain in left knee: Secondary | ICD-10-CM | POA: Diagnosis not present

## 2017-11-06 DIAGNOSIS — M23322 Other meniscus derangements, posterior horn of medial meniscus, left knee: Secondary | ICD-10-CM | POA: Diagnosis not present

## 2017-11-06 DIAGNOSIS — R2689 Other abnormalities of gait and mobility: Secondary | ICD-10-CM | POA: Diagnosis not present

## 2017-11-11 DIAGNOSIS — M1712 Unilateral primary osteoarthritis, left knee: Secondary | ICD-10-CM | POA: Diagnosis not present

## 2017-11-11 DIAGNOSIS — R2689 Other abnormalities of gait and mobility: Secondary | ICD-10-CM | POA: Diagnosis not present

## 2017-11-11 DIAGNOSIS — M23322 Other meniscus derangements, posterior horn of medial meniscus, left knee: Secondary | ICD-10-CM | POA: Diagnosis not present

## 2017-11-11 DIAGNOSIS — M25562 Pain in left knee: Secondary | ICD-10-CM | POA: Diagnosis not present

## 2017-11-11 DIAGNOSIS — M23301 Other meniscus derangements, unspecified lateral meniscus, left knee: Secondary | ICD-10-CM | POA: Diagnosis not present

## 2017-11-11 DIAGNOSIS — M6281 Muscle weakness (generalized): Secondary | ICD-10-CM | POA: Diagnosis not present

## 2017-11-13 DIAGNOSIS — M1712 Unilateral primary osteoarthritis, left knee: Secondary | ICD-10-CM | POA: Diagnosis not present

## 2017-11-13 DIAGNOSIS — M23322 Other meniscus derangements, posterior horn of medial meniscus, left knee: Secondary | ICD-10-CM | POA: Diagnosis not present

## 2017-11-13 DIAGNOSIS — M23301 Other meniscus derangements, unspecified lateral meniscus, left knee: Secondary | ICD-10-CM | POA: Diagnosis not present

## 2017-11-13 DIAGNOSIS — R2689 Other abnormalities of gait and mobility: Secondary | ICD-10-CM | POA: Diagnosis not present

## 2017-11-13 DIAGNOSIS — M25562 Pain in left knee: Secondary | ICD-10-CM | POA: Diagnosis not present

## 2017-11-13 DIAGNOSIS — M6281 Muscle weakness (generalized): Secondary | ICD-10-CM | POA: Diagnosis not present

## 2017-11-27 DIAGNOSIS — I10 Essential (primary) hypertension: Secondary | ICD-10-CM | POA: Diagnosis not present

## 2017-11-27 DIAGNOSIS — R05 Cough: Secondary | ICD-10-CM | POA: Diagnosis not present

## 2017-11-27 DIAGNOSIS — R0981 Nasal congestion: Secondary | ICD-10-CM | POA: Diagnosis not present

## 2017-11-27 DIAGNOSIS — G43009 Migraine without aura, not intractable, without status migrainosus: Secondary | ICD-10-CM | POA: Diagnosis not present

## 2017-12-19 DIAGNOSIS — R790 Abnormal level of blood mineral: Secondary | ICD-10-CM | POA: Diagnosis not present

## 2017-12-25 DIAGNOSIS — N2889 Other specified disorders of kidney and ureter: Secondary | ICD-10-CM | POA: Diagnosis not present

## 2017-12-25 DIAGNOSIS — I10 Essential (primary) hypertension: Secondary | ICD-10-CM | POA: Diagnosis not present

## 2018-01-02 DIAGNOSIS — F419 Anxiety disorder, unspecified: Secondary | ICD-10-CM | POA: Diagnosis not present

## 2018-01-02 DIAGNOSIS — J45909 Unspecified asthma, uncomplicated: Secondary | ICD-10-CM | POA: Diagnosis not present

## 2018-01-02 DIAGNOSIS — E119 Type 2 diabetes mellitus without complications: Secondary | ICD-10-CM | POA: Diagnosis not present

## 2018-01-02 DIAGNOSIS — I10 Essential (primary) hypertension: Secondary | ICD-10-CM | POA: Diagnosis not present

## 2018-01-02 DIAGNOSIS — G43009 Migraine without aura, not intractable, without status migrainosus: Secondary | ICD-10-CM | POA: Diagnosis not present

## 2018-01-02 DIAGNOSIS — E559 Vitamin D deficiency, unspecified: Secondary | ICD-10-CM | POA: Diagnosis not present

## 2018-01-02 DIAGNOSIS — Z79899 Other long term (current) drug therapy: Secondary | ICD-10-CM | POA: Diagnosis not present

## 2018-01-02 DIAGNOSIS — E785 Hyperlipidemia, unspecified: Secondary | ICD-10-CM | POA: Diagnosis not present

## 2018-02-17 DIAGNOSIS — Z1389 Encounter for screening for other disorder: Secondary | ICD-10-CM | POA: Diagnosis not present

## 2018-02-17 DIAGNOSIS — Z1331 Encounter for screening for depression: Secondary | ICD-10-CM | POA: Diagnosis not present

## 2018-02-17 DIAGNOSIS — Z0001 Encounter for general adult medical examination with abnormal findings: Secondary | ICD-10-CM | POA: Diagnosis not present

## 2018-02-17 DIAGNOSIS — I10 Essential (primary) hypertension: Secondary | ICD-10-CM | POA: Diagnosis not present

## 2018-02-17 DIAGNOSIS — M25562 Pain in left knee: Secondary | ICD-10-CM | POA: Diagnosis not present

## 2018-02-17 DIAGNOSIS — E1169 Type 2 diabetes mellitus with other specified complication: Secondary | ICD-10-CM | POA: Diagnosis not present

## 2018-02-17 DIAGNOSIS — Z23 Encounter for immunization: Secondary | ICD-10-CM | POA: Diagnosis not present

## 2018-03-31 DIAGNOSIS — E785 Hyperlipidemia, unspecified: Secondary | ICD-10-CM | POA: Diagnosis not present

## 2018-03-31 DIAGNOSIS — Z79899 Other long term (current) drug therapy: Secondary | ICD-10-CM | POA: Diagnosis not present

## 2018-03-31 DIAGNOSIS — E1165 Type 2 diabetes mellitus with hyperglycemia: Secondary | ICD-10-CM | POA: Diagnosis not present

## 2018-04-05 DIAGNOSIS — E119 Type 2 diabetes mellitus without complications: Secondary | ICD-10-CM | POA: Diagnosis present

## 2018-04-05 DIAGNOSIS — J9601 Acute respiratory failure with hypoxia: Secondary | ICD-10-CM | POA: Diagnosis not present

## 2018-04-05 DIAGNOSIS — M1990 Unspecified osteoarthritis, unspecified site: Secondary | ICD-10-CM | POA: Diagnosis present

## 2018-04-05 DIAGNOSIS — J45909 Unspecified asthma, uncomplicated: Secondary | ICD-10-CM | POA: Diagnosis present

## 2018-04-05 DIAGNOSIS — J90 Pleural effusion, not elsewhere classified: Secondary | ICD-10-CM | POA: Diagnosis not present

## 2018-04-05 DIAGNOSIS — T461X5A Adverse effect of calcium-channel blockers, initial encounter: Secondary | ICD-10-CM | POA: Diagnosis present

## 2018-04-05 DIAGNOSIS — R0602 Shortness of breath: Secondary | ICD-10-CM | POA: Diagnosis not present

## 2018-04-05 DIAGNOSIS — R5381 Other malaise: Secondary | ICD-10-CM | POA: Diagnosis not present

## 2018-04-05 DIAGNOSIS — T447X5A Adverse effect of beta-adrenoreceptor antagonists, initial encounter: Secondary | ICD-10-CM | POA: Diagnosis present

## 2018-04-05 DIAGNOSIS — R42 Dizziness and giddiness: Secondary | ICD-10-CM | POA: Diagnosis not present

## 2018-04-05 DIAGNOSIS — R570 Cardiogenic shock: Secondary | ICD-10-CM | POA: Diagnosis not present

## 2018-04-05 DIAGNOSIS — Z9911 Dependence on respirator [ventilator] status: Secondary | ICD-10-CM | POA: Diagnosis not present

## 2018-04-05 DIAGNOSIS — I4891 Unspecified atrial fibrillation: Secondary | ICD-10-CM | POA: Diagnosis present

## 2018-04-05 DIAGNOSIS — K7200 Acute and subacute hepatic failure without coma: Secondary | ICD-10-CM | POA: Diagnosis not present

## 2018-04-05 DIAGNOSIS — J9811 Atelectasis: Secondary | ICD-10-CM | POA: Diagnosis not present

## 2018-04-05 DIAGNOSIS — R55 Syncope and collapse: Secondary | ICD-10-CM | POA: Diagnosis not present

## 2018-04-05 DIAGNOSIS — Z90710 Acquired absence of both cervix and uterus: Secondary | ICD-10-CM | POA: Diagnosis not present

## 2018-04-05 DIAGNOSIS — E118 Type 2 diabetes mellitus with unspecified complications: Secondary | ICD-10-CM | POA: Diagnosis not present

## 2018-04-05 DIAGNOSIS — Z794 Long term (current) use of insulin: Secondary | ICD-10-CM | POA: Diagnosis not present

## 2018-04-05 DIAGNOSIS — Z79899 Other long term (current) drug therapy: Secondary | ICD-10-CM | POA: Diagnosis not present

## 2018-04-05 DIAGNOSIS — I9589 Other hypotension: Secondary | ICD-10-CM | POA: Diagnosis not present

## 2018-04-05 DIAGNOSIS — Z7984 Long term (current) use of oral hypoglycemic drugs: Secondary | ICD-10-CM | POA: Diagnosis not present

## 2018-04-05 DIAGNOSIS — K219 Gastro-esophageal reflux disease without esophagitis: Secondary | ICD-10-CM | POA: Diagnosis present

## 2018-04-05 DIAGNOSIS — N179 Acute kidney failure, unspecified: Secondary | ICD-10-CM | POA: Diagnosis not present

## 2018-04-05 DIAGNOSIS — J9602 Acute respiratory failure with hypercapnia: Secondary | ICD-10-CM | POA: Diagnosis present

## 2018-04-05 DIAGNOSIS — E7800 Pure hypercholesterolemia, unspecified: Secondary | ICD-10-CM | POA: Diagnosis present

## 2018-04-05 DIAGNOSIS — I071 Rheumatic tricuspid insufficiency: Secondary | ICD-10-CM | POA: Diagnosis present

## 2018-04-05 DIAGNOSIS — Z4682 Encounter for fitting and adjustment of non-vascular catheter: Secondary | ICD-10-CM | POA: Diagnosis not present

## 2018-04-05 DIAGNOSIS — I361 Nonrheumatic tricuspid (valve) insufficiency: Secondary | ICD-10-CM | POA: Diagnosis not present

## 2018-04-05 DIAGNOSIS — E874 Mixed disorder of acid-base balance: Secondary | ICD-10-CM | POA: Diagnosis not present

## 2018-04-05 DIAGNOSIS — R001 Bradycardia, unspecified: Secondary | ICD-10-CM | POA: Diagnosis not present

## 2018-04-05 DIAGNOSIS — I10 Essential (primary) hypertension: Secondary | ICD-10-CM | POA: Diagnosis present

## 2018-04-05 DIAGNOSIS — I959 Hypotension, unspecified: Secondary | ICD-10-CM | POA: Diagnosis not present

## 2018-04-07 DIAGNOSIS — E119 Type 2 diabetes mellitus without complications: Secondary | ICD-10-CM

## 2018-04-07 DIAGNOSIS — K219 Gastro-esophageal reflux disease without esophagitis: Secondary | ICD-10-CM

## 2018-04-07 DIAGNOSIS — E785 Hyperlipidemia, unspecified: Secondary | ICD-10-CM | POA: Insufficient documentation

## 2018-04-07 HISTORY — DX: Hyperlipidemia, unspecified: E78.5

## 2018-04-07 HISTORY — DX: Type 2 diabetes mellitus without complications: E11.9

## 2018-04-07 HISTORY — DX: Gastro-esophageal reflux disease without esophagitis: K21.9

## 2018-04-14 DIAGNOSIS — R001 Bradycardia, unspecified: Secondary | ICD-10-CM | POA: Diagnosis not present

## 2018-04-14 DIAGNOSIS — I10 Essential (primary) hypertension: Secondary | ICD-10-CM | POA: Diagnosis not present

## 2018-04-14 DIAGNOSIS — L03311 Cellulitis of abdominal wall: Secondary | ICD-10-CM | POA: Diagnosis not present

## 2018-04-18 DIAGNOSIS — R079 Chest pain, unspecified: Secondary | ICD-10-CM | POA: Diagnosis not present

## 2018-04-18 DIAGNOSIS — R001 Bradycardia, unspecified: Secondary | ICD-10-CM

## 2018-04-18 DIAGNOSIS — I10 Essential (primary) hypertension: Secondary | ICD-10-CM

## 2018-04-18 HISTORY — DX: Bradycardia, unspecified: R00.1

## 2018-04-18 HISTORY — DX: Essential (primary) hypertension: I10

## 2018-04-21 DIAGNOSIS — R000 Tachycardia, unspecified: Secondary | ICD-10-CM | POA: Diagnosis not present

## 2018-04-21 DIAGNOSIS — R9431 Abnormal electrocardiogram [ECG] [EKG]: Secondary | ICD-10-CM | POA: Diagnosis not present

## 2018-04-22 DIAGNOSIS — E1169 Type 2 diabetes mellitus with other specified complication: Secondary | ICD-10-CM | POA: Diagnosis not present

## 2018-04-22 DIAGNOSIS — I10 Essential (primary) hypertension: Secondary | ICD-10-CM | POA: Diagnosis not present

## 2018-04-22 DIAGNOSIS — R079 Chest pain, unspecified: Secondary | ICD-10-CM | POA: Diagnosis not present

## 2018-04-29 DIAGNOSIS — R079 Chest pain, unspecified: Secondary | ICD-10-CM | POA: Diagnosis not present

## 2018-05-02 DIAGNOSIS — R079 Chest pain, unspecified: Secondary | ICD-10-CM | POA: Diagnosis not present

## 2018-05-05 DIAGNOSIS — R05 Cough: Secondary | ICD-10-CM | POA: Diagnosis not present

## 2018-05-05 DIAGNOSIS — M542 Cervicalgia: Secondary | ICD-10-CM | POA: Diagnosis not present

## 2018-05-05 DIAGNOSIS — E1169 Type 2 diabetes mellitus with other specified complication: Secondary | ICD-10-CM | POA: Diagnosis not present

## 2018-05-05 DIAGNOSIS — R600 Localized edema: Secondary | ICD-10-CM | POA: Diagnosis not present

## 2018-05-05 DIAGNOSIS — J45909 Unspecified asthma, uncomplicated: Secondary | ICD-10-CM | POA: Diagnosis not present

## 2018-05-05 DIAGNOSIS — R079 Chest pain, unspecified: Secondary | ICD-10-CM | POA: Diagnosis not present

## 2018-05-05 DIAGNOSIS — R002 Palpitations: Secondary | ICD-10-CM | POA: Diagnosis not present

## 2018-05-05 DIAGNOSIS — I10 Essential (primary) hypertension: Secondary | ICD-10-CM | POA: Diagnosis not present

## 2018-06-03 DIAGNOSIS — I471 Supraventricular tachycardia, unspecified: Secondary | ICD-10-CM

## 2018-06-03 HISTORY — DX: Supraventricular tachycardia: I47.1

## 2018-06-03 HISTORY — DX: Supraventricular tachycardia, unspecified: I47.10

## 2018-06-23 DIAGNOSIS — J45909 Unspecified asthma, uncomplicated: Secondary | ICD-10-CM | POA: Diagnosis not present

## 2018-06-23 DIAGNOSIS — I10 Essential (primary) hypertension: Secondary | ICD-10-CM | POA: Diagnosis not present

## 2018-06-23 DIAGNOSIS — E1169 Type 2 diabetes mellitus with other specified complication: Secondary | ICD-10-CM | POA: Diagnosis not present

## 2018-06-23 DIAGNOSIS — R05 Cough: Secondary | ICD-10-CM | POA: Diagnosis not present

## 2018-06-23 DIAGNOSIS — E785 Hyperlipidemia, unspecified: Secondary | ICD-10-CM | POA: Diagnosis not present

## 2018-06-23 DIAGNOSIS — G43009 Migraine without aura, not intractable, without status migrainosus: Secondary | ICD-10-CM | POA: Diagnosis not present

## 2018-06-24 ENCOUNTER — Other Ambulatory Visit: Payer: Self-pay | Admitting: *Deleted

## 2018-06-24 ENCOUNTER — Encounter: Payer: Self-pay | Admitting: *Deleted

## 2018-06-24 DIAGNOSIS — E874 Mixed disorder of acid-base balance: Secondary | ICD-10-CM

## 2018-06-24 DIAGNOSIS — J9601 Acute respiratory failure with hypoxia: Secondary | ICD-10-CM

## 2018-06-24 DIAGNOSIS — R570 Cardiogenic shock: Secondary | ICD-10-CM

## 2018-06-24 DIAGNOSIS — K7200 Acute and subacute hepatic failure without coma: Secondary | ICD-10-CM

## 2018-06-24 HISTORY — DX: Acute and subacute hepatic failure without coma: K72.00

## 2018-06-24 HISTORY — DX: Mixed disorder of acid-base balance: E87.4

## 2018-06-24 HISTORY — DX: Cardiogenic shock: R57.0

## 2018-06-24 HISTORY — DX: Acute respiratory failure with hypoxia: J96.01

## 2018-06-26 ENCOUNTER — Other Ambulatory Visit: Payer: Self-pay

## 2018-06-27 DIAGNOSIS — Z7984 Long term (current) use of oral hypoglycemic drugs: Secondary | ICD-10-CM | POA: Diagnosis not present

## 2018-06-27 DIAGNOSIS — I10 Essential (primary) hypertension: Secondary | ICD-10-CM | POA: Diagnosis not present

## 2018-06-27 DIAGNOSIS — H52223 Regular astigmatism, bilateral: Secondary | ICD-10-CM | POA: Diagnosis not present

## 2018-06-27 DIAGNOSIS — H25813 Combined forms of age-related cataract, bilateral: Secondary | ICD-10-CM | POA: Diagnosis not present

## 2018-06-27 DIAGNOSIS — H524 Presbyopia: Secondary | ICD-10-CM | POA: Diagnosis not present

## 2018-06-27 DIAGNOSIS — E119 Type 2 diabetes mellitus without complications: Secondary | ICD-10-CM | POA: Diagnosis not present

## 2018-06-27 DIAGNOSIS — H5203 Hypermetropia, bilateral: Secondary | ICD-10-CM | POA: Diagnosis not present

## 2018-07-01 NOTE — Progress Notes (Signed)
Cardiology Office Note:    Date:  07/02/2018   ID:  Cathy Walker, DOB 04-Mar-1951, MRN [id number]  PCP:  Ernestene Kiel, MD  Cardiologist:  Shirlee More, MD   Referring MD: Ernestene Kiel, MD  ASSESSMENT:    1. Bradycardia   2. PSVT (paroxysmal supraventricular tachycardia) ([place])   3. Essential hypertension   4. Phlebitis    PLAN:    In order of problems listed above:  1. Her bradycardic episode is very uncommon, in essence she had cardiovascular collapse associated with her bradycardia due to combination clonidine calcium channel blocker and beta-blocker.  I would prefer she takes no rate suppressant medication but she tells me she has documented SVT in the past and will keep her on a small dose of beta-blocker provided she can screen her heart rates at home to avoid rates of less than 60/min.  I was going to ask her to wear an ambulatory heart rhythm monitor but she had one in a previous cardiology practice I cannot access her report for this or the myocardial perfusion study in epic I requested those copies directly from the practice and I will hold on ordering any further cardiac testing at this time.  If she has breakthrough documented atrial arrhythmia she would benefit from EP ablation and I would avoid suppressive therapy in combination her antiarrhythmic drug. 2. Unfortunately not documented she is reluctant to stop beta-blocker I will decrease the dose to 50% and have her check her heart rate daily with a blood pressure cuff and contact me if she has rates less than 60 3. Stable continue current treatment avoid any rate limiting medications 4. Since her temporary pacemaker she has right neck pain it is uncommon but she could have thrombophlebitis and a duplex is ordered at [facility]  If she has documented thrombophlebitis she need a course of anticoagulant.  Next appointment in 3 months   Medication Adjustments/Labs and Tests Ordered: Current medicines are  reviewed at length with the patient today.  Concerns regarding medicines are outlined above.  Orders Placed This Encounter  Procedures  . EKG 12-Lead   Meds ordered this encounter  Medications  . carvedilol (COREG) 25 MG tablet    Sig: Take 0.5 tablets (12.5 mg total) by mouth 2 (two) times daily.    Dispense:  60 tablet    Refill:  3     Chief Complaint  Patient presents with  . New Patient (Initial Visit)    History of Present Illness:    Cathy Walker is a 67 y.o. female with a history of hypertension and DM  who is being seen today for the evaluation of SVT at the request of Ernestene Kiel, MD. In May she had profound bradycardia and second degree AV block with hypotension, acidosis and respiratory faulure requiring pressors, inotropic support and temporary pacemaker. She was on a beta blocker, varapamil and clonidine at the time. Echo showed EF > 70%  And severe Lae. Subsequently she has had rapid HR was seen by [facility] cardiology and has an event monitor.  She has no idea how the event occurred  And is adamant that she could not of been mistaken or took excess medication purposely.  Initially she was treated like a beta-blocker overdose with glucagon and had no response.  With profound hypotension and acidosis she required inotropic drugs and pressors and recovered after temporary pacemaker.  She was seen in another cardiology practice [employer]io monitor had a myocardial perfusion study results of which  are unavailable.  I requested those reports.  She is having no chest pain shortness of breath palpitation or syncope and has no known congenital rheumatic or coronary artery disease.  Past Medical History:  Diagnosis Date  . Acute hypoxemic respiratory failure ([place]) 06/24/2018  . Aortic atherosclerosis ([place]) 02/26/2017  . Bradycardia 04/18/2018  . Cardiogenic shock ([place]) 06/24/2018  . Essential hypertension 04/18/2018  . GERD (gastroesophageal reflux disease) 04/07/2018  . HLD  (hyperlipidemia) 04/07/2018  . Hypercalcemia 09/25/2016  . Metabolic acidosis with respiratory acidosis 06/24/2018  . Osteoarthritis of spine 02/21/2017  . PSVT (paroxysmal supraventricular tachycardia) ([place]) 06/03/2018  . Shock liver 06/24/2018  . Type 2 diabetes mellitus without complication ([place]) 0/76/2263  . Unspecified asthma, uncomplicated 33/54/5625  . Vitamin D deficiency 05/09/2016    Past Surgical History:  Procedure Laterality Date  . ABDOMINAL HYSTERECTOMY    . INSERT / REPLACE / REMOVE PACEMAKER    . KNEE SURGERY Left     Current Medications: Current Meds  Medication Sig  . Acetaminophen (TYLENOL ARTHRITIS PAIN PO) Take 1 tablet by mouth daily.  Marland Kitchen amLODipine (NORVASC) 10 MG tablet Take by mouth.  Marland Kitchen aspirin EC 81 MG tablet Take 81 mg by mouth daily.  . Biotin 10000 MCG TABS Take 1 tablet by mouth daily.  Marland Kitchen BUTALBITAL-ACETAMINOPHEN PO Take 1 tablet by mouth as needed.  . carvedilol (COREG) 25 MG tablet Take 0.5 tablets (12.5 mg total) by mouth 2 (two) times daily.  . chlorpheniramine-HYDROcodone (TUSSIONEX) 10-8 MG/5ML SUER TAKE 1 TEASPOONFUL (3mls) BY MOUTH EVERY TWELVE (12) HOURS AS NEEDED FOR COUGH  . Coenzyme Q10 (CO Q 10) 100 MG CAPS Take 1 capsule by mouth daily.  . diclofenac sodium (VOLTAREN) 1 % GEL Apply topically.  . DYMISTA 137-50 MCG/ACT SUSP INHALE 1 SPRAY IN EACH NOSTRIL TWICE (2) DAILY  . furosemide (LASIX) 40 MG tablet Take 40 mg by mouth daily as needed.  Marland Kitchen glipiZIDE (GLUCOTROL) 10 MG tablet Take 10 mg by mouth daily.   Marland Kitchen ibuprofen (ADVIL,MOTRIN) 200 MG tablet Take 200 mg by mouth every 6 (six) hours as needed for headache or moderate pain.  . Magnesium 100 MG TABS Take 100 mg by mouth daily.  . metFORMIN (GLUCOPHAGE) 850 MG tablet Take 850 mg by mouth 3 (three) times daily.   Marland Kitchen nystatin cream (MYCOSTATIN) Apply topically.  . Omega-3 Fatty Acids (FISH OIL) 1200 MG CAPS Take 2 capsules by mouth 2 (two) times daily.  . ranitidine (ZANTAC) 150 MG tablet Take  150 mg by mouth 2 (two) times daily.   . valACYclovir (VALTREX) 1000 MG tablet Take 1,000 mg by mouth daily.   . [DISCONTINUED] carvedilol (COREG) 25 MG tablet Take 25 mg by mouth 2 (two) times daily.     Allergies:   Angiotensin receptor blockers; Codeine; Latex; Meperidine; Exforge [amlodipine besylate-valsartan]; and Sumatriptan   Social History   Socioeconomic History  . Marital status: Single    Spouse name: Not on file  . Number of children: Not on file  . Years of education: Not on file  . Highest education level: Not on file  Occupational History  . Not on file  Social Needs  . Financial resource strain: Not on file  . Food insecurity:    Worry: Not on file    Inability: Not on file  . Transportation needs:    Medical: Not on file    Non-medical: Not on file  Tobacco Use  . Smoking status: Former Smoker  Last attempt to quit: 11/27/1983    Years since quitting: 34.6  . Smokeless tobacco: Never Used  Substance and Sexual Activity  . Alcohol use: Never    Frequency: Never  . Drug use: Not Currently  . Sexual activity: Not on file  Lifestyle  . Physical activity:    Days per week: Not on file    Minutes per session: Not on file  . Stress: Not on file  Relationships  . Social connections:    Talks on phone: Not on file    Gets together: Not on file    Attends religious service: Not on file    Active member of club or organization: Not on file    Attends meetings of clubs or organizations: Not on file    Relationship status: Not on file  Other Topics Concern  . Not on file  Social History Narrative  . Not on file     Family History: The patient's family history includes AAA (abdominal aortic aneurysm) in her mother; CAD in her father; Diabetes in her father; Heart attack in her father; Hypertension in her father and mother; Hyperthyroidism in her mother; Stroke in her father.  ROS:   Review of Systems  Constitution: Negative.  HENT: Negative.   Eyes:  Negative.   Cardiovascular: Negative.   Respiratory: Negative.   Endocrine: Negative.   Hematologic/Lymphatic: Negative.   Skin: Negative.   Musculoskeletal: Negative.   Gastrointestinal: Negative.   Genitourinary: Negative.   Neurological: Negative.   Psychiatric/Behavioral: Positive for memory loss (for events in may 2019).  Allergic/Immunologic: Negative.    Please see the history of present illness.     All other systems reviewed and are negative.  EKGs/Labs/Other Studies Reviewed:   The following studies were reviewed today:   EKG:  EKG is  ordered today.  The ekg ordered today demonstrates sinus rhythm normal conduction intervals poor R wave progression possible anterior septal MI  Recent Labs: No results found for requested labs within last 8760 hours.  Recent Lipid Panel No results found for: CHOL, TRIG, HDL, CHOLHDL, VLDL, LDLCALC, LDLDIRECT  Physical Exam:    VS:  BP (!) 144/88 (BP Location: Left Arm, Patient Position: Sitting, Cuff Size: Normal)   Pulse 88   Ht 5\' 1"  (1.549 m)   Wt 164 lb (74.4 kg)   SpO2 97%   BMI 30.99 kg/m     Wt Readings from Last 3 Encounters:  07/02/18 164 lb (74.4 kg)     GEN:  Well nourished, well developed in no acute distress HEENT: Normal NECK: No JVD; No carotid bruits LYMPHATICS: No lymphadenopathy CARDIAC: RRR, no murmurs, rubs, gallops RESPIRATORY:  Clear to auscultation without rales, wheezing or rhonchi  ABDOMEN: Soft, non-tender, non-distended MUSCULOSKELETAL:  No edema; No deformity  SKIN: Warm and dry NEUROLOGIC:  Alert and oriented x 3 PSYCHIATRIC:  Normal affect     Signed, Shirlee More, MD  07/02/2018 5:05 PM    [facility]

## 2018-07-02 ENCOUNTER — Ambulatory Visit (INDEPENDENT_AMBULATORY_CARE_PROVIDER_SITE_OTHER): Payer: Medicare Other | Admitting: Cardiology

## 2018-07-02 ENCOUNTER — Encounter: Payer: Self-pay | Admitting: Cardiology

## 2018-07-02 VITALS — BP 144/88 | HR 88 | Ht 61.0 in | Wt 164.0 lb

## 2018-07-02 DIAGNOSIS — I10 Essential (primary) hypertension: Secondary | ICD-10-CM

## 2018-07-02 DIAGNOSIS — R001 Bradycardia, unspecified: Secondary | ICD-10-CM

## 2018-07-02 DIAGNOSIS — I809 Phlebitis and thrombophlebitis of unspecified site: Secondary | ICD-10-CM | POA: Diagnosis not present

## 2018-07-02 DIAGNOSIS — I471 Supraventricular tachycardia: Secondary | ICD-10-CM | POA: Diagnosis not present

## 2018-07-02 MED ORDER — CARVEDILOL 25 MG PO TABS: 12.5000 mg | ORAL_TABLET | Freq: Two times a day (BID) | ORAL | 3 refills | Status: DC

## 2018-07-02 NOTE — Patient Instructions (Addendum)
Medication Instructions:  Your physician has recommended you make the following change in your medication:  Decrease: Carvedilol to 12.5 mg twice daily  Labwork: None.  Testing/Procedures: Dr. Bettina Gavia has ordered a duplex of the right internal jugular vein.    Follow-Up: Your physician wants you to follow-up in: 3 months. You will receive a reminder letter in the mail two months in advance. If you don't receive a letter, please call our office to schedule the follow-up appointment.   Any Other Special Instructions Will Be Listed Below (If Applicable).  Check heart rate daily. If lower than 60 beats per minute call office.     If you need a refill on your cardiac medications before your next appointment, please call your pharmacy.

## 2018-07-03 DIAGNOSIS — E559 Vitamin D deficiency, unspecified: Secondary | ICD-10-CM | POA: Diagnosis not present

## 2018-07-03 DIAGNOSIS — E785 Hyperlipidemia, unspecified: Secondary | ICD-10-CM | POA: Diagnosis not present

## 2018-07-03 DIAGNOSIS — E1169 Type 2 diabetes mellitus with other specified complication: Secondary | ICD-10-CM | POA: Diagnosis not present

## 2018-07-03 DIAGNOSIS — I10 Essential (primary) hypertension: Secondary | ICD-10-CM | POA: Diagnosis not present

## 2018-07-03 DIAGNOSIS — Z79899 Other long term (current) drug therapy: Secondary | ICD-10-CM | POA: Diagnosis not present

## 2018-07-18 ENCOUNTER — Telehealth: Payer: Self-pay

## 2018-07-18 ENCOUNTER — Other Ambulatory Visit: Payer: Self-pay

## 2018-07-18 DIAGNOSIS — M542 Cervicalgia: Secondary | ICD-10-CM | POA: Diagnosis not present

## 2018-07-18 DIAGNOSIS — T829XXA Unspecified complication of cardiac and vascular prosthetic device, implant and graft, initial encounter: Secondary | ICD-10-CM | POA: Diagnosis not present

## 2018-07-18 DIAGNOSIS — I809 Phlebitis and thrombophlebitis of unspecified site: Secondary | ICD-10-CM

## 2018-07-18 NOTE — Telephone Encounter (Signed)
Patient notified of normal carotid ultrasound results per Dr Bettina Gavia with no DVT in jugular vein. Patient verbalized understanding.

## 2018-08-27 DIAGNOSIS — J45909 Unspecified asthma, uncomplicated: Secondary | ICD-10-CM | POA: Diagnosis not present

## 2018-08-27 DIAGNOSIS — R600 Localized edema: Secondary | ICD-10-CM | POA: Diagnosis not present

## 2018-08-27 DIAGNOSIS — L650 Telogen effluvium: Secondary | ICD-10-CM | POA: Diagnosis not present

## 2018-08-27 DIAGNOSIS — Z6833 Body mass index (BMI) 33.0-33.9, adult: Secondary | ICD-10-CM | POA: Diagnosis not present

## 2018-08-27 DIAGNOSIS — G43009 Migraine without aura, not intractable, without status migrainosus: Secondary | ICD-10-CM | POA: Diagnosis not present

## 2018-08-27 DIAGNOSIS — E785 Hyperlipidemia, unspecified: Secondary | ICD-10-CM | POA: Diagnosis not present

## 2018-08-27 DIAGNOSIS — I700 Atherosclerosis of aorta: Secondary | ICD-10-CM | POA: Diagnosis not present

## 2018-08-27 DIAGNOSIS — E1169 Type 2 diabetes mellitus with other specified complication: Secondary | ICD-10-CM | POA: Diagnosis not present

## 2018-08-27 DIAGNOSIS — E669 Obesity, unspecified: Secondary | ICD-10-CM | POA: Diagnosis not present

## 2018-08-27 DIAGNOSIS — I10 Essential (primary) hypertension: Secondary | ICD-10-CM | POA: Diagnosis not present

## 2018-08-27 DIAGNOSIS — Z79899 Other long term (current) drug therapy: Secondary | ICD-10-CM | POA: Diagnosis not present

## 2018-08-27 DIAGNOSIS — R05 Cough: Secondary | ICD-10-CM | POA: Diagnosis not present

## 2018-09-01 ENCOUNTER — Telehealth: Payer: Self-pay | Admitting: Cardiology

## 2018-09-01 NOTE — Telephone Encounter (Signed)
Legs, feet and ankles are so swollen, thinks it's from [employer]

## 2018-09-01 NOTE — Telephone Encounter (Signed)
This is a noted side effect of norvasc. According to the patient she is very swollen including her hands. Has had some slight swelling since starting this medication in May but it has just recently worsened. Patient wanted to point out that she is intolerant to many of the BP meds that have coughing as a side effect as it causes her to have coughing. Please advise?

## 2018-09-03 NOTE — Telephone Encounter (Signed)
Left voicemail for the patient to call the office. 

## 2018-09-03 NOTE — Telephone Encounter (Signed)
See if the patient is willing to take the lowest dose of chlorthalidone 1 tablet daily and encourage potassium supplementation in diet.  Stop amlodipine.  Lett her come for a pulse blood pressure and Chem-7 in a week.

## 2018-09-05 ENCOUNTER — Other Ambulatory Visit: Payer: Self-pay

## 2018-09-05 DIAGNOSIS — I10 Essential (primary) hypertension: Secondary | ICD-10-CM

## 2018-09-05 MED ORDER — CHLORTHALIDONE 25 MG PO TABS: 25.0000 mg | ORAL_TABLET | Freq: Every day | ORAL | 0 refills | Status: DC

## 2018-09-05 NOTE — Telephone Encounter (Signed)
Patient is unable to come in for an appointment due to being at work. Patient will take her BP at home and log this. Patient is agreeable to come for labs. Orders have been placed.

## 2018-10-06 ENCOUNTER — Ambulatory Visit (INDEPENDENT_AMBULATORY_CARE_PROVIDER_SITE_OTHER): Payer: Medicare Other | Admitting: Cardiology

## 2018-10-06 VITALS — BP 168/88 | HR 82 | Ht 61.0 in | Wt 169.4 lb

## 2018-10-06 DIAGNOSIS — I10 Essential (primary) hypertension: Secondary | ICD-10-CM | POA: Diagnosis not present

## 2018-10-06 DIAGNOSIS — I471 Supraventricular tachycardia: Secondary | ICD-10-CM

## 2018-10-06 DIAGNOSIS — R001 Bradycardia, unspecified: Secondary | ICD-10-CM | POA: Diagnosis not present

## 2018-10-06 MED ORDER — CHLORTHALIDONE 25 MG PO TABS: 25.0000 mg | ORAL_TABLET | Freq: Every day | ORAL | 1 refills | Status: DC

## 2018-10-06 NOTE — Progress Notes (Signed)
Cardiology Office Note:    Date:  10/06/2018   ID:  Cathy Walker, DOB 09-23-1951, MRN [id number]  PCP:  Ernestene Kiel, MD  Cardiologist:  Shirlee More, MD    Referring MD: Ernestene Kiel, MD    ASSESSMENT:    1. Bradycardia   2. PSVT (paroxysmal supraventricular tachycardia) ([place])   3. Essential hypertension    PLAN:    In order of problems listed above:  1. Stable she follows heart rates at home closely and has had no recurrent bradycardia.  She will continue her beta-blocker if we document arrhythmia requiring further suppressant therapy she will need EP referral for interventional ablation or back-up pacemaker before additional heart rhythm suppressant medications. 2. Stable no recurrence continue single agent beta-blocker her home blood pressures have been better than measurements in the office 3. Stable and improved with a diuretic off calcium channel blocker continue the same and she will have labs done at PCP office next few weeks including renal function and potassium   Next appointment: 6 months   Medication Adjustments/Labs and Tests Ordered: Current medicines are reviewed at length with the patient today.  Concerns regarding medicines are outlined above.  No orders of the defined types were placed in this encounter.  No orders of the defined types were placed in this encounter.   Chief Complaint  Patient presents with  . Bradycardia    History of Present Illness:     Cathy Walker is a 67 y.o. female with a hx of hypertension and DM  who is being seen today for the evaluation of SVT at the request of Ernestene Kiel, MD. In May she had profound bradycardia and second degree AV block with hypotension, acidosis and respiratory faulure requiring pressors, inotropic support and temporary pacemaker. She was on a beta blocker, varapamil and clonidine at the time. Echo showed EF > 70%  And severe Lae. Subsequently she has had rapid HR was seen by [facility]  cardiology and has an event monitor. She was  last seen 07/02/18. Compliance with diet, lifestyle and medications: Yes  She has had some palpitation and frequent not severe sustained.  Closely monitors her heart rate at home and consistently greater than 60 and usually it is 70 to 80 bpm range.  She has had no syncope.  She is somewhat traumatized by the events of her bradycardia and I reassured her the best of my ability she is not at risk for recurrence.  If she were to have recurrent sustained tachycardia documented she be appropriate for EP referral and I would not put her on combined suppressant treatment she had marked peripheral edema with a calcium channel blocker was switched to thiazide diuretic will continue the same and is due for labs with her PCP in the next few weeks and I asked her to be sure they check renal function.  She is concerned about her diabetes A1c is now 7.1 I told her to speak with her PCP about other treatment such as Victoza.  I reviewed her ZIO patch monitor she wore through the [facility] office she wore 1 day 1 hour she had sinus rhythm she had no bradycardia and no significant arrhythmia she also had a myocardial perfusion study there is no official report but is described as normal at the office visit Past Medical History:  Diagnosis Date  . Acute hypoxemic respiratory failure ([place]) 06/24/2018  . Aortic atherosclerosis ([place]) 02/26/2017  . Bradycardia 04/18/2018  . Cardiogenic shock ([place]) 06/24/2018  .  Essential hypertension 04/18/2018  . GERD (gastroesophageal reflux disease) 04/07/2018  . HLD (hyperlipidemia) 04/07/2018  . Hypercalcemia 09/25/2016  . Metabolic acidosis with respiratory acidosis 06/24/2018  . Osteoarthritis of spine 02/21/2017  . PSVT (paroxysmal supraventricular tachycardia) ([place]) 06/03/2018  . Shock liver 06/24/2018  . Type 2 diabetes mellitus without complication ([place]) 5/73/2202  . Unspecified asthma, uncomplicated 54/27/0623  . Vitamin D  deficiency 05/09/2016    Past Surgical History:  Procedure Laterality Date  . ABDOMINAL HYSTERECTOMY    . INSERT / REPLACE / REMOVE PACEMAKER    . KNEE SURGERY Left     Current Medications: Current Meds  Medication Sig  . Acetaminophen (TYLENOL ARTHRITIS PAIN PO) Take 1 tablet by mouth daily.  Marland Kitchen aspirin EC 81 MG tablet Take 81 mg by mouth daily.  . Biotin 10000 MCG TABS Take 1 tablet by mouth daily.  Marland Kitchen BUTALBITAL-ACETAMINOPHEN PO Take 1 tablet by mouth as needed.  . carvedilol (COREG) 25 MG tablet Take 0.5 tablets (12.5 mg total) by mouth 2 (two) times daily.  . chlorpheniramine-HYDROcodone (TUSSIONEX) 10-8 MG/5ML SUER TAKE 1 TEASPOONFUL (7mls) BY MOUTH EVERY TWELVE (12) HOURS AS NEEDED FOR COUGH  . chlorthalidone (HYGROTON) 25 MG tablet Take 1 tablet (25 mg total) by mouth daily.  . Coenzyme Q10 (CO Q 10) 100 MG CAPS Take 1 capsule by mouth daily.  . diclofenac sodium (VOLTAREN) 1 % GEL Apply topically.  . DYMISTA 137-50 MCG/ACT SUSP INHALE 1 SPRAY IN EACH NOSTRIL TWICE (2) DAILY  . furosemide (LASIX) 40 MG tablet Take 40 mg by mouth daily as needed.  Marland Kitchen glipiZIDE (GLUCOTROL) 10 MG tablet Take 10 mg by mouth 2 (two) times daily before a meal.   . ibuprofen (ADVIL,MOTRIN) 200 MG tablet Take 200 mg by mouth every 6 (six) hours as needed for headache or moderate pain.  . Magnesium 100 MG TABS Take 100 mg by mouth daily.  . metFORMIN (GLUCOPHAGE) 850 MG tablet Take 850 mg by mouth 3 (three) times daily.   Marland Kitchen nystatin cream (MYCOSTATIN) Apply topically.  . Omega-3 Fatty Acids (FISH OIL) 1200 MG CAPS Take 2 capsules by mouth 2 (two) times daily.  . ranitidine (ZANTAC) 150 MG tablet Take 150 mg by mouth 2 (two) times daily.   . valACYclovir (VALTREX) 1000 MG tablet Take 1,000 mg by mouth daily.      Allergies:   Angiotensin receptor blockers; Codeine; Latex; Meperidine; Exforge [amlodipine besylate-valsartan]; and Sumatriptan   Social History   Socioeconomic History  . Marital status:  Single    Spouse name: Not on file  . Number of children: Not on file  . Years of education: Not on file  . Highest education level: Not on file  Occupational History  . Not on file  Social Needs  . Financial resource strain: Not on file  . Food insecurity:    Worry: Not on file    Inability: Not on file  . Transportation needs:    Medical: Not on file    Non-medical: Not on file  Tobacco Use  . Smoking status: Former Smoker    Last attempt to quit: 11/27/1983    Years since quitting: 34.8  . Smokeless tobacco: Never Used  Substance and Sexual Activity  . Alcohol use: Never    Frequency: Never  . Drug use: Not Currently  . Sexual activity: Not on file  Lifestyle  . Physical activity:    Days per week: Not on file    Minutes per session: Not  on file  . Stress: Not on file  Relationships  . Social connections:    Talks on phone: Not on file    Gets together: Not on file    Attends religious service: Not on file    Active member of club or organization: Not on file    Attends meetings of clubs or organizations: Not on file    Relationship status: Not on file  Other Topics Concern  . Not on file  Social History Narrative  . Not on file     Family History: The patient's family history includes AAA (abdominal aortic aneurysm) in her mother; CAD in her father; Diabetes in her father; Heart attack in her father; Hypertension in her father and mother; Hyperthyroidism in her mother; Stroke in her father. ROS:   Please see the history of present illness.    All other systems reviewed and are negative.  EKGs/Labs/Other Studies Reviewed:    The following studies were reviewed today:  EKG:  EKG ordered today.  The ekg ordered today demonstrates sinus rhythm abnormal R wave progression she has had echocardiogram and myocardial perfusion study has not had a myocardial infarction  Recent Labs: Last hemoglobin A1c 7.12 for labs in the next few weeks and her PCP office No results  found for requested labs within last 8760 hours.  Recent Lipid Panel No results found for: CHOL, TRIG, HDL, CHOLHDL, VLDL, LDLCALC, LDLDIRECT  Physical Exam:    VS:  BP (!) 168/88 (BP Location: Left Arm, Patient Position: Sitting, Cuff Size: Normal)   Pulse 82   Ht 5\' 1"  (1.549 m)   Wt 169 lb 6.4 oz (76.8 kg)   SpO2 96%   BMI 32.01 kg/m     Wt Readings from Last 3 Encounters:  10/06/18 169 lb 6.4 oz (76.8 kg)  07/02/18 164 lb (74.4 kg)     GEN:  Well nourished, well developed in no acute distress HEENT: Normal NECK: No JVD; No carotid bruits LYMPHATICS: No lymphadenopathy CARDIAC: RRR, no murmurs, rubs, gallops RESPIRATORY:  Clear to auscultation without rales, wheezing or rhonchi  ABDOMEN: Soft, non-tender, non-distended MUSCULOSKELETAL:  No edema; No deformity  SKIN: Warm and dry NEUROLOGIC:  Alert and oriented x 3 PSYCHIATRIC:  Normal affect    Signed, Shirlee More, MD  10/06/2018 4:42 PM     [facility]

## 2018-10-06 NOTE — Patient Instructions (Signed)

## 2018-10-20 DIAGNOSIS — E669 Obesity, unspecified: Secondary | ICD-10-CM | POA: Diagnosis not present

## 2018-10-20 DIAGNOSIS — G4733 Obstructive sleep apnea (adult) (pediatric): Secondary | ICD-10-CM | POA: Diagnosis not present

## 2018-10-20 DIAGNOSIS — E1169 Type 2 diabetes mellitus with other specified complication: Secondary | ICD-10-CM | POA: Diagnosis not present

## 2018-10-20 DIAGNOSIS — Z6833 Body mass index (BMI) 33.0-33.9, adult: Secondary | ICD-10-CM | POA: Diagnosis not present

## 2018-10-20 DIAGNOSIS — I10 Essential (primary) hypertension: Secondary | ICD-10-CM | POA: Diagnosis not present

## 2018-11-12 DIAGNOSIS — E669 Obesity, unspecified: Secondary | ICD-10-CM | POA: Diagnosis not present

## 2018-11-12 DIAGNOSIS — E1169 Type 2 diabetes mellitus with other specified complication: Secondary | ICD-10-CM | POA: Diagnosis not present

## 2018-11-12 DIAGNOSIS — I10 Essential (primary) hypertension: Secondary | ICD-10-CM | POA: Diagnosis not present

## 2018-11-12 DIAGNOSIS — Z6834 Body mass index (BMI) 34.0-34.9, adult: Secondary | ICD-10-CM | POA: Diagnosis not present

## 2018-11-12 DIAGNOSIS — G4733 Obstructive sleep apnea (adult) (pediatric): Secondary | ICD-10-CM | POA: Diagnosis not present

## 2018-11-27 DIAGNOSIS — E785 Hyperlipidemia, unspecified: Secondary | ICD-10-CM | POA: Diagnosis not present

## 2018-11-27 DIAGNOSIS — Z79899 Other long term (current) drug therapy: Secondary | ICD-10-CM | POA: Diagnosis not present

## 2018-11-27 DIAGNOSIS — E1169 Type 2 diabetes mellitus with other specified complication: Secondary | ICD-10-CM | POA: Diagnosis not present

## 2018-12-08 DIAGNOSIS — Z6833 Body mass index (BMI) 33.0-33.9, adult: Secondary | ICD-10-CM | POA: Diagnosis not present

## 2018-12-08 DIAGNOSIS — R109 Unspecified abdominal pain: Secondary | ICD-10-CM | POA: Diagnosis not present

## 2018-12-09 DIAGNOSIS — K5790 Diverticulosis of intestine, part unspecified, without perforation or abscess without bleeding: Secondary | ICD-10-CM | POA: Diagnosis not present

## 2018-12-09 DIAGNOSIS — R109 Unspecified abdominal pain: Secondary | ICD-10-CM | POA: Diagnosis not present

## 2018-12-10 DIAGNOSIS — E1165 Type 2 diabetes mellitus with hyperglycemia: Secondary | ICD-10-CM | POA: Diagnosis not present

## 2018-12-10 DIAGNOSIS — M545 Low back pain: Secondary | ICD-10-CM | POA: Diagnosis not present

## 2018-12-10 DIAGNOSIS — M47819 Spondylosis without myelopathy or radiculopathy, site unspecified: Secondary | ICD-10-CM | POA: Diagnosis not present

## 2018-12-10 DIAGNOSIS — Z6833 Body mass index (BMI) 33.0-33.9, adult: Secondary | ICD-10-CM | POA: Diagnosis not present

## 2018-12-10 DIAGNOSIS — E781 Pure hyperglyceridemia: Secondary | ICD-10-CM | POA: Diagnosis not present

## 2018-12-10 DIAGNOSIS — E669 Obesity, unspecified: Secondary | ICD-10-CM | POA: Diagnosis not present

## 2018-12-10 DIAGNOSIS — G4733 Obstructive sleep apnea (adult) (pediatric): Secondary | ICD-10-CM | POA: Diagnosis not present

## 2018-12-10 DIAGNOSIS — I700 Atherosclerosis of aorta: Secondary | ICD-10-CM | POA: Diagnosis not present

## 2018-12-10 DIAGNOSIS — I10 Essential (primary) hypertension: Secondary | ICD-10-CM | POA: Diagnosis not present

## 2019-01-29 DIAGNOSIS — H539 Unspecified visual disturbance: Secondary | ICD-10-CM | POA: Diagnosis not present

## 2019-01-29 DIAGNOSIS — E1169 Type 2 diabetes mellitus with other specified complication: Secondary | ICD-10-CM | POA: Diagnosis not present

## 2019-01-29 DIAGNOSIS — Z6832 Body mass index (BMI) 32.0-32.9, adult: Secondary | ICD-10-CM | POA: Diagnosis not present

## 2019-01-29 DIAGNOSIS — G4733 Obstructive sleep apnea (adult) (pediatric): Secondary | ICD-10-CM | POA: Diagnosis not present

## 2019-01-29 DIAGNOSIS — E785 Hyperlipidemia, unspecified: Secondary | ICD-10-CM | POA: Diagnosis not present

## 2019-01-29 DIAGNOSIS — F419 Anxiety disorder, unspecified: Secondary | ICD-10-CM | POA: Diagnosis not present

## 2019-01-29 DIAGNOSIS — K130 Diseases of lips: Secondary | ICD-10-CM | POA: Diagnosis not present

## 2019-01-29 DIAGNOSIS — R05 Cough: Secondary | ICD-10-CM | POA: Diagnosis not present

## 2019-01-29 DIAGNOSIS — F5101 Primary insomnia: Secondary | ICD-10-CM | POA: Diagnosis not present

## 2019-01-29 DIAGNOSIS — I10 Essential (primary) hypertension: Secondary | ICD-10-CM | POA: Diagnosis not present

## 2019-03-02 DIAGNOSIS — K760 Fatty (change of) liver, not elsewhere classified: Secondary | ICD-10-CM | POA: Diagnosis not present

## 2019-03-02 DIAGNOSIS — R3129 Other microscopic hematuria: Secondary | ICD-10-CM | POA: Diagnosis not present

## 2019-03-02 DIAGNOSIS — Z780 Asymptomatic menopausal state: Secondary | ICD-10-CM | POA: Diagnosis not present

## 2019-03-02 DIAGNOSIS — N3020 Other chronic cystitis without hematuria: Secondary | ICD-10-CM | POA: Diagnosis not present

## 2019-03-02 DIAGNOSIS — N453 Epididymo-orchitis: Secondary | ICD-10-CM | POA: Diagnosis not present

## 2019-03-02 DIAGNOSIS — E785 Hyperlipidemia, unspecified: Secondary | ICD-10-CM | POA: Diagnosis not present

## 2019-03-02 DIAGNOSIS — M8589 Other specified disorders of bone density and structure, multiple sites: Secondary | ICD-10-CM | POA: Diagnosis not present

## 2019-03-02 DIAGNOSIS — R29898 Other symptoms and signs involving the musculoskeletal system: Secondary | ICD-10-CM | POA: Diagnosis not present

## 2019-03-02 DIAGNOSIS — Z79899 Other long term (current) drug therapy: Secondary | ICD-10-CM | POA: Diagnosis not present

## 2019-03-02 DIAGNOSIS — R109 Unspecified abdominal pain: Secondary | ICD-10-CM | POA: Diagnosis not present

## 2019-03-02 DIAGNOSIS — N3281 Overactive bladder: Secondary | ICD-10-CM | POA: Diagnosis not present

## 2019-03-02 DIAGNOSIS — Z9049 Acquired absence of other specified parts of digestive tract: Secondary | ICD-10-CM | POA: Diagnosis not present

## 2019-03-02 DIAGNOSIS — E663 Overweight: Secondary | ICD-10-CM | POA: Diagnosis not present

## 2019-03-02 DIAGNOSIS — N200 Calculus of kidney: Secondary | ICD-10-CM | POA: Diagnosis not present

## 2019-03-02 DIAGNOSIS — E1169 Type 2 diabetes mellitus with other specified complication: Secondary | ICD-10-CM | POA: Diagnosis not present

## 2019-03-02 DIAGNOSIS — I700 Atherosclerosis of aorta: Secondary | ICD-10-CM | POA: Diagnosis not present

## 2019-03-02 DIAGNOSIS — Z0000 Encounter for general adult medical examination without abnormal findings: Secondary | ICD-10-CM | POA: Diagnosis not present

## 2019-03-02 DIAGNOSIS — I10 Essential (primary) hypertension: Secondary | ICD-10-CM | POA: Diagnosis not present

## 2019-03-02 DIAGNOSIS — N1339 Other hydronephrosis: Secondary | ICD-10-CM | POA: Diagnosis not present

## 2019-03-02 DIAGNOSIS — R300 Dysuria: Secondary | ICD-10-CM | POA: Diagnosis not present

## 2019-03-02 DIAGNOSIS — Z87442 Personal history of urinary calculi: Secondary | ICD-10-CM | POA: Diagnosis not present

## 2019-03-02 DIAGNOSIS — E559 Vitamin D deficiency, unspecified: Secondary | ICD-10-CM | POA: Diagnosis not present

## 2019-03-02 DIAGNOSIS — F17210 Nicotine dependence, cigarettes, uncomplicated: Secondary | ICD-10-CM | POA: Diagnosis not present

## 2019-05-19 DIAGNOSIS — K219 Gastro-esophageal reflux disease without esophagitis: Secondary | ICD-10-CM | POA: Diagnosis not present

## 2019-05-19 DIAGNOSIS — L570 Actinic keratosis: Secondary | ICD-10-CM | POA: Diagnosis not present

## 2019-05-19 DIAGNOSIS — F5101 Primary insomnia: Secondary | ICD-10-CM | POA: Diagnosis not present

## 2019-05-19 DIAGNOSIS — E1169 Type 2 diabetes mellitus with other specified complication: Secondary | ICD-10-CM | POA: Diagnosis not present

## 2019-05-19 DIAGNOSIS — I10 Essential (primary) hypertension: Secondary | ICD-10-CM | POA: Diagnosis not present

## 2019-05-19 DIAGNOSIS — Z6832 Body mass index (BMI) 32.0-32.9, adult: Secondary | ICD-10-CM | POA: Diagnosis not present

## 2019-05-19 DIAGNOSIS — F419 Anxiety disorder, unspecified: Secondary | ICD-10-CM | POA: Diagnosis not present

## 2019-07-07 DIAGNOSIS — E1169 Type 2 diabetes mellitus with other specified complication: Secondary | ICD-10-CM | POA: Diagnosis not present

## 2019-07-07 DIAGNOSIS — R002 Palpitations: Secondary | ICD-10-CM | POA: Diagnosis not present

## 2019-07-07 DIAGNOSIS — Z6831 Body mass index (BMI) 31.0-31.9, adult: Secondary | ICD-10-CM | POA: Diagnosis not present

## 2019-07-07 DIAGNOSIS — R079 Chest pain, unspecified: Secondary | ICD-10-CM | POA: Diagnosis not present

## 2019-07-07 DIAGNOSIS — E785 Hyperlipidemia, unspecified: Secondary | ICD-10-CM | POA: Diagnosis not present

## 2019-07-07 DIAGNOSIS — I10 Essential (primary) hypertension: Secondary | ICD-10-CM | POA: Diagnosis not present

## 2019-07-07 DIAGNOSIS — L989 Disorder of the skin and subcutaneous tissue, unspecified: Secondary | ICD-10-CM | POA: Diagnosis not present

## 2019-07-07 DIAGNOSIS — Z79899 Other long term (current) drug therapy: Secondary | ICD-10-CM | POA: Diagnosis not present

## 2019-07-25 DIAGNOSIS — G4733 Obstructive sleep apnea (adult) (pediatric): Secondary | ICD-10-CM | POA: Diagnosis not present

## 2019-07-28 DIAGNOSIS — C44629 Squamous cell carcinoma of skin of left upper limb, including shoulder: Secondary | ICD-10-CM | POA: Diagnosis not present

## 2019-07-28 DIAGNOSIS — L578 Other skin changes due to chronic exposure to nonionizing radiation: Secondary | ICD-10-CM | POA: Diagnosis not present

## 2019-07-28 DIAGNOSIS — L570 Actinic keratosis: Secondary | ICD-10-CM | POA: Diagnosis not present

## 2019-07-28 DIAGNOSIS — E876 Hypokalemia: Secondary | ICD-10-CM | POA: Diagnosis not present

## 2019-07-28 DIAGNOSIS — L814 Other melanin hyperpigmentation: Secondary | ICD-10-CM | POA: Diagnosis not present

## 2019-08-04 DIAGNOSIS — E876 Hypokalemia: Secondary | ICD-10-CM | POA: Diagnosis not present

## 2019-08-19 DIAGNOSIS — G4733 Obstructive sleep apnea (adult) (pediatric): Secondary | ICD-10-CM | POA: Diagnosis not present

## 2019-08-25 DIAGNOSIS — G4733 Obstructive sleep apnea (adult) (pediatric): Secondary | ICD-10-CM | POA: Diagnosis not present

## 2019-09-24 DIAGNOSIS — G4733 Obstructive sleep apnea (adult) (pediatric): Secondary | ICD-10-CM | POA: Diagnosis not present

## 2019-10-07 DIAGNOSIS — E1169 Type 2 diabetes mellitus with other specified complication: Secondary | ICD-10-CM | POA: Diagnosis not present

## 2019-10-07 DIAGNOSIS — Z79899 Other long term (current) drug therapy: Secondary | ICD-10-CM | POA: Diagnosis not present

## 2019-10-07 DIAGNOSIS — I495 Sick sinus syndrome: Secondary | ICD-10-CM | POA: Diagnosis not present

## 2019-10-07 DIAGNOSIS — R42 Dizziness and giddiness: Secondary | ICD-10-CM | POA: Diagnosis not present

## 2019-10-07 DIAGNOSIS — Z0001 Encounter for general adult medical examination with abnormal findings: Secondary | ICD-10-CM | POA: Diagnosis not present

## 2019-10-07 DIAGNOSIS — M545 Low back pain: Secondary | ICD-10-CM | POA: Diagnosis not present

## 2019-10-07 DIAGNOSIS — Z1339 Encounter for screening examination for other mental health and behavioral disorders: Secondary | ICD-10-CM | POA: Diagnosis not present

## 2019-10-07 DIAGNOSIS — E559 Vitamin D deficiency, unspecified: Secondary | ICD-10-CM | POA: Diagnosis not present

## 2019-10-07 DIAGNOSIS — G4700 Insomnia, unspecified: Secondary | ICD-10-CM | POA: Diagnosis not present

## 2019-10-07 DIAGNOSIS — Z1331 Encounter for screening for depression: Secondary | ICD-10-CM | POA: Diagnosis not present

## 2019-10-07 DIAGNOSIS — E785 Hyperlipidemia, unspecified: Secondary | ICD-10-CM | POA: Diagnosis not present

## 2019-10-07 DIAGNOSIS — Z7189 Other specified counseling: Secondary | ICD-10-CM | POA: Diagnosis not present

## 2019-10-07 DIAGNOSIS — Z1159 Encounter for screening for other viral diseases: Secondary | ICD-10-CM | POA: Diagnosis not present

## 2019-10-07 DIAGNOSIS — G4733 Obstructive sleep apnea (adult) (pediatric): Secondary | ICD-10-CM | POA: Diagnosis not present

## 2019-10-07 DIAGNOSIS — I10 Essential (primary) hypertension: Secondary | ICD-10-CM | POA: Diagnosis not present

## 2019-10-13 DIAGNOSIS — Z1211 Encounter for screening for malignant neoplasm of colon: Secondary | ICD-10-CM | POA: Diagnosis not present

## 2019-10-25 DIAGNOSIS — G4733 Obstructive sleep apnea (adult) (pediatric): Secondary | ICD-10-CM | POA: Diagnosis not present

## 2019-11-03 DIAGNOSIS — R42 Dizziness and giddiness: Secondary | ICD-10-CM | POA: Diagnosis not present

## 2019-11-03 DIAGNOSIS — I7090 Unspecified atherosclerosis: Secondary | ICD-10-CM | POA: Diagnosis not present

## 2019-11-24 DIAGNOSIS — G4733 Obstructive sleep apnea (adult) (pediatric): Secondary | ICD-10-CM | POA: Diagnosis not present

## 2019-11-26 DIAGNOSIS — E876 Hypokalemia: Secondary | ICD-10-CM | POA: Diagnosis not present

## 2019-11-26 DIAGNOSIS — G4733 Obstructive sleep apnea (adult) (pediatric): Secondary | ICD-10-CM | POA: Diagnosis not present

## 2019-11-26 DIAGNOSIS — G4700 Insomnia, unspecified: Secondary | ICD-10-CM | POA: Diagnosis not present

## 2019-11-26 DIAGNOSIS — E1169 Type 2 diabetes mellitus with other specified complication: Secondary | ICD-10-CM | POA: Diagnosis not present

## 2019-11-26 DIAGNOSIS — I10 Essential (primary) hypertension: Secondary | ICD-10-CM | POA: Diagnosis not present

## 2019-11-26 DIAGNOSIS — E785 Hyperlipidemia, unspecified: Secondary | ICD-10-CM | POA: Diagnosis not present

## 2019-11-26 DIAGNOSIS — Z6834 Body mass index (BMI) 34.0-34.9, adult: Secondary | ICD-10-CM | POA: Diagnosis not present

## 2019-11-26 DIAGNOSIS — J45909 Unspecified asthma, uncomplicated: Secondary | ICD-10-CM | POA: Diagnosis not present

## 2019-11-26 DIAGNOSIS — R195 Other fecal abnormalities: Secondary | ICD-10-CM | POA: Diagnosis not present

## 2019-11-26 DIAGNOSIS — E041 Nontoxic single thyroid nodule: Secondary | ICD-10-CM | POA: Diagnosis not present

## 2019-12-25 DIAGNOSIS — G4733 Obstructive sleep apnea (adult) (pediatric): Secondary | ICD-10-CM | POA: Diagnosis not present

## 2020-01-22 ENCOUNTER — Ambulatory Visit: Payer: BC Managed Care – PPO

## 2020-03-01 DIAGNOSIS — I700 Atherosclerosis of aorta: Secondary | ICD-10-CM | POA: Diagnosis not present

## 2020-03-01 DIAGNOSIS — Z79899 Other long term (current) drug therapy: Secondary | ICD-10-CM | POA: Diagnosis not present

## 2020-03-01 DIAGNOSIS — Z6833 Body mass index (BMI) 33.0-33.9, adult: Secondary | ICD-10-CM | POA: Diagnosis not present

## 2020-03-01 DIAGNOSIS — E1169 Type 2 diabetes mellitus with other specified complication: Secondary | ICD-10-CM | POA: Diagnosis not present

## 2020-03-01 DIAGNOSIS — Z7189 Other specified counseling: Secondary | ICD-10-CM | POA: Diagnosis not present

## 2020-03-01 DIAGNOSIS — Z1331 Encounter for screening for depression: Secondary | ICD-10-CM | POA: Diagnosis not present

## 2020-03-01 DIAGNOSIS — E785 Hyperlipidemia, unspecified: Secondary | ICD-10-CM | POA: Diagnosis not present

## 2020-03-01 DIAGNOSIS — F5101 Primary insomnia: Secondary | ICD-10-CM | POA: Diagnosis not present

## 2020-03-01 DIAGNOSIS — I10 Essential (primary) hypertension: Secondary | ICD-10-CM | POA: Diagnosis not present

## 2020-04-20 DIAGNOSIS — Z79899 Other long term (current) drug therapy: Secondary | ICD-10-CM | POA: Diagnosis not present

## 2020-05-19 DIAGNOSIS — Z79899 Other long term (current) drug therapy: Secondary | ICD-10-CM | POA: Diagnosis not present

## 2020-06-21 ENCOUNTER — Other Ambulatory Visit: Payer: Self-pay

## 2020-06-30 DIAGNOSIS — I700 Atherosclerosis of aorta: Secondary | ICD-10-CM | POA: Diagnosis not present

## 2020-06-30 DIAGNOSIS — I10 Essential (primary) hypertension: Secondary | ICD-10-CM | POA: Diagnosis not present

## 2020-06-30 DIAGNOSIS — Z6834 Body mass index (BMI) 34.0-34.9, adult: Secondary | ICD-10-CM | POA: Diagnosis not present

## 2020-06-30 DIAGNOSIS — E785 Hyperlipidemia, unspecified: Secondary | ICD-10-CM | POA: Diagnosis not present

## 2020-06-30 DIAGNOSIS — Z79899 Other long term (current) drug therapy: Secondary | ICD-10-CM | POA: Diagnosis not present

## 2020-06-30 DIAGNOSIS — E1169 Type 2 diabetes mellitus with other specified complication: Secondary | ICD-10-CM | POA: Diagnosis not present

## 2021-01-03 DIAGNOSIS — J029 Acute pharyngitis, unspecified: Secondary | ICD-10-CM | POA: Diagnosis not present

## 2021-01-03 DIAGNOSIS — R059 Cough, unspecified: Secondary | ICD-10-CM | POA: Diagnosis not present

## 2021-01-03 DIAGNOSIS — R5381 Other malaise: Secondary | ICD-10-CM | POA: Diagnosis not present

## 2021-01-12 DIAGNOSIS — E1169 Type 2 diabetes mellitus with other specified complication: Secondary | ICD-10-CM | POA: Diagnosis not present

## 2021-01-12 DIAGNOSIS — J0100 Acute maxillary sinusitis, unspecified: Secondary | ICD-10-CM | POA: Diagnosis not present

## 2021-01-12 DIAGNOSIS — I10 Essential (primary) hypertension: Secondary | ICD-10-CM | POA: Diagnosis not present

## 2021-01-19 DIAGNOSIS — E559 Vitamin D deficiency, unspecified: Secondary | ICD-10-CM | POA: Diagnosis not present

## 2021-01-19 DIAGNOSIS — E785 Hyperlipidemia, unspecified: Secondary | ICD-10-CM | POA: Diagnosis not present

## 2021-01-19 DIAGNOSIS — Z79899 Other long term (current) drug therapy: Secondary | ICD-10-CM | POA: Diagnosis not present

## 2021-01-19 DIAGNOSIS — E1169 Type 2 diabetes mellitus with other specified complication: Secondary | ICD-10-CM | POA: Diagnosis not present

## 2021-01-19 DIAGNOSIS — I10 Essential (primary) hypertension: Secondary | ICD-10-CM | POA: Diagnosis not present

## 2021-03-09 DIAGNOSIS — M65331 Trigger finger, right middle finger: Secondary | ICD-10-CM | POA: Diagnosis not present

## 2021-03-23 DIAGNOSIS — J4520 Mild intermittent asthma, uncomplicated: Secondary | ICD-10-CM | POA: Diagnosis not present

## 2021-03-23 DIAGNOSIS — E1169 Type 2 diabetes mellitus with other specified complication: Secondary | ICD-10-CM | POA: Diagnosis not present

## 2021-03-23 DIAGNOSIS — Z1331 Encounter for screening for depression: Secondary | ICD-10-CM | POA: Diagnosis not present

## 2021-03-23 DIAGNOSIS — I10 Essential (primary) hypertension: Secondary | ICD-10-CM | POA: Diagnosis not present

## 2021-03-23 DIAGNOSIS — Z7189 Other specified counseling: Secondary | ICD-10-CM | POA: Diagnosis not present

## 2021-03-23 DIAGNOSIS — E785 Hyperlipidemia, unspecified: Secondary | ICD-10-CM | POA: Diagnosis not present

## 2021-03-23 DIAGNOSIS — J302 Other seasonal allergic rhinitis: Secondary | ICD-10-CM | POA: Diagnosis not present

## 2021-03-23 DIAGNOSIS — Z0000 Encounter for general adult medical examination without abnormal findings: Secondary | ICD-10-CM | POA: Diagnosis not present

## 2021-03-23 DIAGNOSIS — Z1339 Encounter for screening examination for other mental health and behavioral disorders: Secondary | ICD-10-CM | POA: Diagnosis not present

## 2021-03-27 DIAGNOSIS — M65331 Trigger finger, right middle finger: Secondary | ICD-10-CM | POA: Diagnosis not present

## 2021-03-27 DIAGNOSIS — M65332 Trigger finger, left middle finger: Secondary | ICD-10-CM | POA: Diagnosis not present

## 2021-04-11 DIAGNOSIS — M180 Bilateral primary osteoarthritis of first carpometacarpal joints: Secondary | ICD-10-CM | POA: Diagnosis not present

## 2021-04-11 DIAGNOSIS — M65332 Trigger finger, left middle finger: Secondary | ICD-10-CM | POA: Diagnosis not present

## 2021-04-11 DIAGNOSIS — M65331 Trigger finger, right middle finger: Secondary | ICD-10-CM | POA: Diagnosis not present

## 2021-04-11 DIAGNOSIS — M79641 Pain in right hand: Secondary | ICD-10-CM | POA: Diagnosis not present

## 2021-04-11 DIAGNOSIS — M19031 Primary osteoarthritis, right wrist: Secondary | ICD-10-CM | POA: Diagnosis not present

## 2021-04-11 DIAGNOSIS — M79642 Pain in left hand: Secondary | ICD-10-CM | POA: Diagnosis not present

## 2021-04-13 ENCOUNTER — Ambulatory Visit (INDEPENDENT_AMBULATORY_CARE_PROVIDER_SITE_OTHER): Payer: HMO | Admitting: Cardiology

## 2021-04-13 ENCOUNTER — Other Ambulatory Visit: Payer: Self-pay

## 2021-04-13 ENCOUNTER — Encounter: Payer: Self-pay | Admitting: Cardiology

## 2021-04-13 VITALS — BP 140/62 | HR 89 | Ht 59.0 in | Wt 168.8 lb

## 2021-04-13 DIAGNOSIS — I10 Essential (primary) hypertension: Secondary | ICD-10-CM

## 2021-04-13 DIAGNOSIS — E119 Type 2 diabetes mellitus without complications: Secondary | ICD-10-CM

## 2021-04-13 DIAGNOSIS — I471 Supraventricular tachycardia: Secondary | ICD-10-CM

## 2021-04-13 DIAGNOSIS — R001 Bradycardia, unspecified: Secondary | ICD-10-CM

## 2021-04-13 MED ORDER — CARVEDILOL 6.25 MG PO TABS: 6.2500 mg | ORAL_TABLET | Freq: Two times a day (BID) | ORAL | 0 refills | Status: DC

## 2021-04-13 NOTE — Patient Instructions (Addendum)
Medication Instructions:  Your physician has recommended you make the following change in your medication:   Start Carvedilol 6.25 mg take extra tablet daily as needed for persistent heart rate greater than 125-130.  *If you need a refill on your cardiac medications before your next appointment, please call your pharmacy*   Lab Work: None ordered If you have labs (blood work) drawn today and your tests are completely normal, you will receive your results only by: Marland Kitchen MyChart Message (if you have MyChart) OR . A paper copy in the mail If you have any lab test that is abnormal or we need to change your treatment, we will call you to review the results.   Testing/Procedures: You need a Zio patch after your hand surgery.  Follow-Up: At [facility], you and your health needs are our priority.  As part of our continuing mission to provide you with exceptional heart care, we have created designated Provider Care Teams.  These Care Teams include your primary Cardiologist (physician) and Advanced Practice Providers (APPs -  Physician Assistants and Nurse Practitioners) who all work together to provide you with the care you need, when you need it.  We recommend signing up for the patient portal called "MyChart".  Sign up information is provided on this After Visit Summary.  MyChart is used to connect with patients for Virtual Visits (Telemedicine).  Patients are able to view lab/test results, encounter notes, upcoming appointments, etc.  Non-urgent messages can be sent to your provider as well.   To learn more about what you can do with MyChart, go to [email or web address].    Your next appointment:   3 month(s)  The format for your next appointment:   In Person  Provider:   Jyl Heinz, MD   Other Instructions Carvedilol Tablets What is this medicine? CARVEDILOL (KAR ve dil ol) is a beta blocker. It decreases the amount of work your heart has to do and helps your heart beat  regularly. It treats high blood pressure. This medicine may be used for other purposes; ask your health care provider or pharmacist if you have questions. COMMON BRAND NAME(S): Coreg What should I tell my health care provider before I take this medicine? They need to know if you have any of these conditions:  circulation problems  diabetes  history of heart attack or heart disease  liver disease  lung or breathing disease, like asthma or emphysema  pheochromocytoma  slow or irregular heartbeat  thyroid disease  an unusual or allergic reaction to carvedilol, other beta-blockers, medicines, foods, dyes, or preservatives  pregnant or trying to get pregnant  breast-feeding How should I use this medicine? Take this drug by mouth. Take it as directed on the prescription label at the same time every day. Take it with food. Keep taking it unless your health care provider tells you to stop. Talk to your health care provider about the use of this drug in children. Special care may be needed. Overdosage: If you think you have taken too much of this medicine contact a poison control center or emergency room at once. NOTE: This medicine is only for you. Do not share this medicine with others. What if I miss a dose? If you miss a dose, take it as soon as you can. If it is almost time for your next dose, take only that dose. Do not take double or extra doses. What may interact with this medicine? This medicine may interact with the following medications:  certain  medicines for blood pressure, heart disease, irregular heart beat  certain medicines for depression, like fluoxetine or paroxetine  certain medicines for diabetes, like glipizide or glyburide  cimetidine  clonidine  cyclosporine  digoxin  MAOIs like Carbex, Eldepryl, Marplan, Nardil, and Parnate  reserpine  rifampin This list may not describe all possible interactions. Give your health care provider a list of all the  medicines, herbs, non-prescription drugs, or dietary supplements you use. Also tell them if you smoke, drink alcohol, or use illegal drugs. Some items may interact with your medicine. What should I watch for while using this medicine? Check your heart rate and blood pressure regularly while you are taking this medicine. Ask your doctor or health care professional what your heart rate and blood pressure should be, and when you should contact him or her. Do not stop taking this medicine suddenly. This could lead to serious heart-related effects. Contact your doctor or health care professional if you have difficulty breathing while taking this drug. Check your weight daily. Ask your doctor or health care professional when you should notify him/her of any weight gain. You may get drowsy or dizzy. Do not drive, use machinery, or do anything that requires mental alertness until you know how this medicine affects you. To reduce the risk of dizzy or fainting spells, do not sit or stand up quickly. Alcohol can make you more drowsy, and increase flushing and rapid heartbeats. Avoid alcoholic drinks. This medicine may increase blood sugar. Ask your healthcare provider if changes in diet or medicines are needed if you have diabetes. If you are going to have surgery, tell your doctor or health care professional that you are taking this medicine. What side effects may I notice from receiving this medicine? Side effects that you should report to your doctor or health care professional as soon as possible:  allergic reactions like skin rash, itching or hives, swelling of the face, lips, or tongue  breathing problems  dark urine  irregular heartbeat   signs and symptoms of high blood sugar such as being more thirsty or hungry or having to urinate more than normal. You may also feel very tired or have blurry vision.  swollen legs or ankles  vomiting  yellowing of the eyes or skin Side effects that usually  do not require medical attention (report to your doctor or health care professional if they continue or are bothersome):  change in sex drive or performance  diarrhea  dry eyes (especially if wearing contact lenses)  dry, itching skin  headache  nausea  unusually tired This list may not describe all possible side effects. Call your doctor for medical advice about side effects. You may report side effects to FDA at 1-800-FDA-1088. Where should I keep my medicine? Keep out of the reach of children and pets. Store at room temperature between 20 and 25 degrees C (68 and 77 degrees F). Protect from moisture. Keep the container tightly closed. Throw away any unused drug after the expiration date. NOTE: This sheet is a summary. It may not cover all possible information. If you have questions about this medicine, talk to your doctor, pharmacist, or health care provider.  2021 Elsevier/Gold Standard (2019-06-19 17:42:09)

## 2021-04-13 NOTE — Progress Notes (Signed)
Cardiology Office Note:    Date:  04/13/2021   ID:  Cathy Walker, DOB Jul 10, 1951, MRN [id number]  PCP:  Ernestene Kiel, MD  Cardiologist:  Shirlee More, MD    Referring MD: Ernestene Kiel, MD    ASSESSMENT:    1. PSVT (paroxysmal supraventricular tachycardia) ([place])   2. Essential hypertension   3. Bradycardia   4. Type 2 diabetes mellitus without complication, without long-term current use of insulin (HCC)    PLAN:    In order of problems listed above:  1. Overall she has done well concerned about intermittent episodes of rapid heart rhythm she has had significant bradycardia in the past but combined suppressive treatment and after hand surgery utilize a 2-week CO monitor to screen her heart rhythm.  We will give her a prescription for small amount of half-strength carvedilol that she can take as needed for breakthrough episodes. 2. Stable BP at target continue current multidrug regimen avoiding rate slowing antihypertensive medications 3. Stable check ZIO monitor Stable managed by her  Next appointment: 3 months    Medication Adjustments/Labs and Tests Ordered: Current medicines are reviewed at length with the patient today.  Concerns regarding medicines are outlined above.  No orders of the defined types were placed in this encounter.  No orders of the defined types were placed in this encounter.   Chief Complaint  Patient presents with  . Follow-up    For SVT    History of Present Illness:    Cathy Walker is a 70 y.o. female with a hx of hypertension hyperlipidemia type 2 diabetes mellitus SVT and previous bradycardia with second-degree AV block hypotension acidosis and respiratory failure requiring pressors inotropic support and temporary pacemaker with a combination of beta-blocker perampanel and clonidine.  Echocardiogram showed EF greater than 70% with left atrial enlargement.  She was last seen by me 10/06/2018.  At that time tachyarrhythmias controlled  with a beta-blocker.  Compliance with diet, lifestyle and medications: yes  Overall she is done well rarely reports to function of rapid heart rhythm or waxing waning during the day not severe sustained beyond that. No bradycardia heart rate is in range at home she tracks on a regular basis greater than 60 blood pressure at target. She is having hand surgery done for joint disease and trigger finger.  No chest pain or shortness of breath Most recent labs 01/19/2021 cholesterol 225 LDL 129 triglycerides 370 HDL 29 A1c elevated 7.3%   Past Medical History:  Diagnosis Date  . Acute hypoxemic respiratory failure ([place]) 06/24/2018  . Aortic atherosclerosis ([place]) 02/26/2017  . Bradycardia 04/18/2018  . Cardiogenic shock ([place]) 06/24/2018  . Essential hypertension 04/18/2018  . GERD (gastroesophageal reflux disease) 04/07/2018  . HLD (hyperlipidemia) 04/07/2018  . Hypercalcemia 09/25/2016  . Metabolic acidosis with respiratory acidosis 06/24/2018  . Osteoarthritis of spine 02/21/2017  . PSVT (paroxysmal supraventricular tachycardia) ([place]) 06/03/2018  . Shock liver 06/24/2018  . Type 2 diabetes mellitus without complication ([place]) 2/95/1884  . Unspecified asthma, uncomplicated 16/60/6301  . Vitamin D deficiency 05/09/2016    Past Surgical History:  Procedure Laterality Date  . ABDOMINAL HYSTERECTOMY    . INSERT / REPLACE / REMOVE PACEMAKER    . KNEE SURGERY Left     Current Medications: Current Meds  Medication Sig  . acyclovir (ZOVIRAX) 400 MG tablet Take 400 mg by mouth as needed (rash).  Marland Kitchen albuterol (VENTOLIN HFA) 108 (90 Base) MCG/ACT inhaler Inhale 2 puffs into the lungs every 6 (six) hours as  needed for wheezing or shortness of breath.  . Albuterol Sulfate 2.5 MG/0.5ML NEBU Inhale 0.5 mLs into the lungs as needed (wheezing and shortness of breath).  . ALPRAZolam (XANAX) 0.25 MG tablet Take 0.25 mg by mouth 2 (two) times daily as needed for anxiety.  . Ascorbic Acid (VITAMIN C) 1000 MG  tablet Take 1,000 mg by mouth daily.  Marland Kitchen aspirin EC 81 MG tablet Take 81 mg by mouth daily.  . Biotin 10000 MCG TABS Take 1 tablet by mouth daily.  . carvedilol (COREG) 25 MG tablet Take 0.5 tablets (12.5 mg total) by mouth 2 (two) times daily.  . chlorthalidone (HYGROTON) 50 MG tablet Take 50 mg by mouth daily.  . Cholecalciferol (D3) 50 MCG (2000 UT) TABS Take 2,000 Units by mouth daily.  Marland Kitchen DYMISTA 137-50 MCG/ACT SUSP INHALE 1 SPRAY IN EACH NOSTRIL TWICE (2) DAILY  . fenofibrate (TRICOR) 145 MG tablet Take 145 mg by mouth daily.  . furosemide (LASIX) 40 MG tablet Take 40 mg by mouth daily as needed for fluid or edema.  Marland Kitchen glipiZIDE (GLUCOTROL XL) 10 MG 24 hr tablet Take 10 mg by mouth 2 (two) times daily.  . hydrALAZINE (APRESOLINE) 25 MG tablet Take 25 mg by mouth 2 (two) times daily.  Marland Kitchen MAGNESIUM CHLORIDE-CALCIUM PO Take 2 tablets by mouth every morning. And take 1 tablet at night  . meclizine (ANTIVERT) 25 MG tablet Take 25 mg by mouth as needed for dizziness.  . montelukast (SINGULAIR) 10 MG tablet Take 10 mg by mouth at bedtime.  . pioglitazone (ACTOS) 30 MG tablet Take 30 mg by mouth daily.  . potassium chloride SA (KLOR-CON) 20 MEQ tablet Take 20 mEq by mouth 2 (two) times daily.  . Semaglutide,0.25 or 0.5MG /DOS, (OZEMPIC, 0.25 OR 0.5 MG/DOSE,) 2 MG/1.5ML SOPN Inject 0.5 mg into the skin once a week.  . temazepam (RESTORIL) 30 MG capsule Take 30 mg by mouth at bedtime as needed for sleep.  Marland Kitchen tiZANidine (ZANAFLEX) 4 MG tablet Take 4 mg by mouth as needed for muscle spasms.  Marland Kitchen zinc gluconate 50 MG tablet Take 50 mg by mouth daily.     Allergies:   Angiotensin receptor blockers, Codeine, Latex, Meperidine, Exforge [amlodipine besylate-valsartan], and Sumatriptan   Social History   Socioeconomic History  . Marital status: Single    Spouse name: Not on file  . Number of children: Not on file  . Years of education: Not on file  . Highest education level: Not on file  Occupational  History  . Not on file  Tobacco Use  . Smoking status: Former Smoker    Quit date: 11/27/1983    Years since quitting: 37.4  . Smokeless tobacco: Never Used  Vaping Use  . Vaping Use: Never used  Substance and Sexual Activity  . Alcohol use: Never  . Drug use: Not Currently  . Sexual activity: Not on file  Other Topics Concern  . Not on file  Social History Narrative  . Not on file   Social Determinants of Health   Financial Resource Strain: Not on file  Food Insecurity: Not on file  Transportation Needs: Not on file  Physical Activity: Not on file  Stress: Not on file  Social Connections: Not on file     Family History: The patient's family history includes AAA (abdominal aortic aneurysm) in her mother; CAD in her father; Diabetes in her father; Heart attack in her father; Hypertension in her father and mother; Hyperthyroidism in her mother;  Stroke in her father. ROS:   Please see the history of present illness.    All other systems reviewed and are negative.  EKGs/Labs/Other Studies Reviewed:    The following studies were reviewed today:  EKG:  EKG ordered today and personally reviewed.  The ekg ordered today demonstrates sinus rhythm first-degree AV block QS in V3 lead placement versus old anteroseptal MI this pattern is unchanged from 10/06/2018    Physical Exam:    VS:  BP 140/62   Pulse 89   Ht 4\' 11"  (1.499 m)   Wt 168 lb 12.8 oz (76.6 kg)   SpO2 98%   BMI 34.09 kg/m     Wt Readings from Last 3 Encounters:  04/13/21 168 lb 12.8 oz (76.6 kg)  10/06/18 169 lb 6.4 oz (76.8 kg)  07/02/18 164 lb (74.4 kg)     GEN:  Well nourished, well developed in no acute distress HEENT: Normal NECK: No JVD; No carotid bruits LYMPHATICS: No lymphadenopathy CARDIAC: RRR, no murmurs, rubs, gallops RESPIRATORY:  Clear to auscultation without rales, wheezing or rhonchi  ABDOMEN: Soft, non-tender, non-distended MUSCULOSKELETAL:  No edema; No deformity  SKIN: Warm and  dry NEUROLOGIC:  Alert and oriented x 3 PSYCHIATRIC:  Normal affect    Signed, Shirlee More, MD  04/13/2021 2:48 PM    [facility]

## 2021-04-14 ENCOUNTER — Ambulatory Visit (INDEPENDENT_AMBULATORY_CARE_PROVIDER_SITE_OTHER): Payer: HMO

## 2021-04-14 DIAGNOSIS — I471 Supraventricular tachycardia: Secondary | ICD-10-CM

## 2021-04-14 DIAGNOSIS — I10 Essential (primary) hypertension: Secondary | ICD-10-CM

## 2021-04-14 DIAGNOSIS — E119 Type 2 diabetes mellitus without complications: Secondary | ICD-10-CM

## 2021-04-14 DIAGNOSIS — R001 Bradycardia, unspecified: Secondary | ICD-10-CM

## 2021-04-14 NOTE — Addendum Note (Signed)
Addended by: Resa Miner I on: 04/14/2021 08:39 AM   Modules accepted: Orders

## 2021-04-18 DIAGNOSIS — Z79899 Other long term (current) drug therapy: Secondary | ICD-10-CM | POA: Diagnosis not present

## 2021-04-18 DIAGNOSIS — E1169 Type 2 diabetes mellitus with other specified complication: Secondary | ICD-10-CM | POA: Diagnosis not present

## 2021-04-18 DIAGNOSIS — E785 Hyperlipidemia, unspecified: Secondary | ICD-10-CM | POA: Diagnosis not present

## 2021-04-26 DIAGNOSIS — E785 Hyperlipidemia, unspecified: Secondary | ICD-10-CM | POA: Diagnosis not present

## 2021-04-26 DIAGNOSIS — E1169 Type 2 diabetes mellitus with other specified complication: Secondary | ICD-10-CM | POA: Diagnosis not present

## 2021-04-26 DIAGNOSIS — I10 Essential (primary) hypertension: Secondary | ICD-10-CM | POA: Diagnosis not present

## 2021-04-26 DIAGNOSIS — G4700 Insomnia, unspecified: Secondary | ICD-10-CM | POA: Diagnosis not present

## 2021-04-26 DIAGNOSIS — F419 Anxiety disorder, unspecified: Secondary | ICD-10-CM | POA: Diagnosis not present

## 2021-04-26 DIAGNOSIS — K219 Gastro-esophageal reflux disease without esophagitis: Secondary | ICD-10-CM | POA: Diagnosis not present

## 2021-05-05 ENCOUNTER — Other Ambulatory Visit: Payer: Self-pay | Admitting: Orthopedic Surgery

## 2021-05-06 DIAGNOSIS — I471 Supraventricular tachycardia: Secondary | ICD-10-CM | POA: Diagnosis not present

## 2021-05-06 DIAGNOSIS — E119 Type 2 diabetes mellitus without complications: Secondary | ICD-10-CM | POA: Diagnosis not present

## 2021-05-06 DIAGNOSIS — I10 Essential (primary) hypertension: Secondary | ICD-10-CM

## 2021-05-06 DIAGNOSIS — R001 Bradycardia, unspecified: Secondary | ICD-10-CM | POA: Diagnosis not present

## 2021-05-16 ENCOUNTER — Telehealth: Payer: Self-pay | Admitting: Cardiology

## 2021-05-16 NOTE — Telephone Encounter (Signed)
Spoke to the patient just now and let her know the [facility] that Dr. Bettina Gavia normally recommends is the basic one for $90. She verbalizes understanding.

## 2021-05-16 NOTE — Telephone Encounter (Signed)
Patient states no one in town has a [facility] mobile. She states she went online and there are all kinds. She would like to know which one she should get.

## 2021-05-16 NOTE — Telephone Encounter (Signed)
Spoke to patient just now and let her know that this was fine. I let her know that we would call her back once we get the results back from the monitor.   I also advised that she get the [facility] app/system for her phone so that she can monitor her heart rate and rhythm.

## 2021-05-16 NOTE — Telephone Encounter (Signed)
Patient would like to inform Dr. Bettina Gavia that her heart monitor fell off on her 3rd day of wearing it. She states she was mowing her grass, sweating profusely and she couldn't get it to stick back on. Unable to complete her monitoring cycle, she states she sent it back. She states she wants to wait until the fall if possible, when the weather cools down, so she won't sweat while wearing it.

## 2021-05-17 DIAGNOSIS — I10 Essential (primary) hypertension: Secondary | ICD-10-CM | POA: Diagnosis not present

## 2021-05-17 DIAGNOSIS — R001 Bradycardia, unspecified: Secondary | ICD-10-CM | POA: Diagnosis not present

## 2021-05-17 DIAGNOSIS — I471 Supraventricular tachycardia: Secondary | ICD-10-CM | POA: Diagnosis not present

## 2021-05-17 DIAGNOSIS — E119 Type 2 diabetes mellitus without complications: Secondary | ICD-10-CM | POA: Diagnosis not present

## 2021-05-24 ENCOUNTER — Telehealth: Payer: Self-pay | Admitting: *Deleted

## 2021-05-24 DIAGNOSIS — R062 Wheezing: Secondary | ICD-10-CM | POA: Diagnosis not present

## 2021-05-24 DIAGNOSIS — R221 Localized swelling, mass and lump, neck: Secondary | ICD-10-CM | POA: Diagnosis not present

## 2021-05-24 NOTE — Telephone Encounter (Signed)
Spoke with [employer] per Dr. Bettina Gavia. Requested that another monitor be sent to pt to try to wear longer than 2 days 19 hrs. Once 2nd monitor is sent in would like the 2 reports combined and sent to [place]. Pt will bring monitor in and have it put on since she put the 1st one on and it didn't last long.

## 2021-05-25 ENCOUNTER — Telehealth: Payer: Self-pay | Admitting: Cardiology

## 2021-05-25 NOTE — Telephone Encounter (Signed)
VM 6/30

## 2021-05-25 NOTE — Telephone Encounter (Signed)
   [facility] Pre-operative Risk Assessment    Patient Name: Jannett Schmall  DOB: May 27, 1951  MRN: [id number]   HEARTCARE STAFF: - Please ensure there is not already an duplicate clearance open for this procedure. - Under Visit Info/Reason for Call, type in Other and utilize the format Clearance MM/DD/YY or Clearance TBD. Do not use dashes or single digits. - If request is for dental extraction, please clarify the # of teeth to be extracted. - If the patient is currently at the dentist's office, call Pre-Op APP to address. If the patient is not currently in the dentist office, please route to the Pre-Op pool  Request for surgical clearance:  What type of surgery is being performed? R Thumb trapeziectomy and suspensionplasty, R Long trigger release   When is this surgery scheduled? 06/29/21  What type of clearance is required (medical clearance vs. Pharmacy clearance to hold med vs. Both)? Pharmacy  Are there any medications that need to be held prior to surgery and how long? Aspirin   Practice name and name of physician performing surgery? Dr. Leanora Cover  What is the office phone number? [phone]   7.   What is the office fax number? [phone]  8.   Anesthesia type (None, local, MAC, general) ? Choice    Johnna Acosta 05/25/2021, 9:23 AM  _________________________________________________________________   (provider comments below)

## 2021-05-26 ENCOUNTER — Ambulatory Visit: Payer: BC Managed Care – PPO

## 2021-05-26 NOTE — Telephone Encounter (Signed)
Vail Basista 70 year old female is requesting preoperative cardiac evaluation for right thumb trapeziectomy and suspensionplasty, and R Long finger trigger release.  She was last seen in the clinic by you on 04/13/2021.  Overall she was doing well.  She noted intermittent episodes of rapid heart rate.  A cardiac event monitor was ordered and showed minimum heart rate of 53, maximum heart rate of 109, average heart rate of 77 bpm.  Her predominant underlying rhythm was sinus rhythm.  She was noted to have first-degree AV block, isolated SVE's which were rare less than 1%, SVE couplets were also rare less than 1% and no SVE triplets were present.  No other arrhythmias were noted.  She reported compliance with her diet, lifestyle and medications.  She was prescribed carvedilol to be used for her breakthrough palpitations.  Her PMH includes HTN, HLD, type 2 diabetes, SVT, and previous bradycardia with second-degree AV block.  Her echocardiogram  showed an EF of 70% with left atrial enlargement..  May her aspirin be held prior to procedure?  Thank you for your help.  Please direct response to CV DIV preop pool.  Jossie Ng. Leith Hedlund NP-C    05/26/2021, 8:49 AM [place] [address] Office [phone] Fax [phone]

## 2021-05-26 NOTE — Telephone Encounter (Signed)
Patient is returning call.  °

## 2021-05-26 NOTE — Telephone Encounter (Signed)
   Primary Cardiologist: Shirlee More, MD  Chart reviewed as part of pre-operative protocol coverage. Given past medical history and time since last visit, based on ACC/AHA guidelines, Esthela Brandner would be at acceptable risk for the planned procedure without further cardiovascular testing.   She may hold her aspirin for 5-7 days prior to her procedure.  Please resume aspirin therapy 24-48 hours after procedure.  Patient was advised that if she develops new symptoms prior to surgery to contact our office to arrange a follow-up appointment.  She verbalized understanding.  I will route this recommendation to the requesting party via Epic fax function and remove from pre-op pool.  Please call with questions.     Jossie Ng. Kassius Battiste NP-C    05/26/2021, 10:01 AM [place] [address] Office [phone] Fax [phone]

## 2021-05-30 ENCOUNTER — Telehealth: Payer: Self-pay

## 2021-05-30 DIAGNOSIS — J9811 Atelectasis: Secondary | ICD-10-CM | POA: Diagnosis not present

## 2021-05-30 DIAGNOSIS — R221 Localized swelling, mass and lump, neck: Secondary | ICD-10-CM | POA: Diagnosis not present

## 2021-05-30 DIAGNOSIS — R062 Wheezing: Secondary | ICD-10-CM | POA: Diagnosis not present

## 2021-05-30 NOTE — Telephone Encounter (Signed)
Patient came into the office on Friday 05/26/2021 to get her new heart monitor put on. It was mailed to her but she did not feel comfortable putting it on herself so she stopped by the office for assistance. The monitor was placed with no issues and the patient left the office.

## 2021-06-08 DIAGNOSIS — I10 Essential (primary) hypertension: Secondary | ICD-10-CM | POA: Diagnosis not present

## 2021-06-08 DIAGNOSIS — G720 Drug-induced myopathy: Secondary | ICD-10-CM | POA: Diagnosis not present

## 2021-06-08 DIAGNOSIS — J0120 Acute ethmoidal sinusitis, unspecified: Secondary | ICD-10-CM | POA: Diagnosis not present

## 2021-06-08 DIAGNOSIS — E1169 Type 2 diabetes mellitus with other specified complication: Secondary | ICD-10-CM | POA: Diagnosis not present

## 2021-06-08 DIAGNOSIS — E785 Hyperlipidemia, unspecified: Secondary | ICD-10-CM | POA: Diagnosis not present

## 2021-06-12 ENCOUNTER — Telehealth: Payer: Self-pay

## 2021-06-12 NOTE — Telephone Encounter (Signed)
Spoke with patient regarding results and recommendation.  Patient verbalizes understanding and is agreeable to plan of care. Advised patient to call back with any issues or concerns.  

## 2021-06-12 NOTE — Telephone Encounter (Signed)
-----   Message from Richardo Priest, MD sent at 06/11/2021  7:30 PM EDT ----- Normal or stable result  Her monitor is stable and there are no rapid heart rhythms present.  Please send a note to [facility] there in [place] that she is under my care as a cardiologist and I do not think that she should take Adderall in view of her previous severe cardiac rhythm problems

## 2021-06-13 DIAGNOSIS — R937 Abnormal findings on diagnostic imaging of other parts of musculoskeletal system: Secondary | ICD-10-CM | POA: Diagnosis not present

## 2021-06-13 DIAGNOSIS — Z0389 Encounter for observation for other suspected diseases and conditions ruled out: Secondary | ICD-10-CM | POA: Diagnosis not present

## 2021-06-26 ENCOUNTER — Telehealth: Payer: Self-pay | Admitting: Cardiology

## 2021-06-26 NOTE — Telephone Encounter (Signed)
Spoke to the patient just now and she let me know that she is unable to fill out an EMMI form that she needs to fill out. She tells me that she can not access it because she does not have Internet at home.   I advised that I do not know what this form would be and I reached out to our front desk and they are not aware of it either.   She states that she will reach out to me if she hears anything about it again.

## 2021-06-26 NOTE — Telephone Encounter (Signed)
New Message:     Patient said she was unable to get on line to fill out the EMMI form. She was told to call here and let our office know this, so they could help her geet this taken care of please.

## 2021-06-28 ENCOUNTER — Encounter (HOSPITAL_BASED_OUTPATIENT_CLINIC_OR_DEPARTMENT_OTHER): Payer: Self-pay | Admitting: Orthopedic Surgery

## 2021-06-28 ENCOUNTER — Other Ambulatory Visit: Payer: Self-pay

## 2021-07-06 ENCOUNTER — Ambulatory Visit (HOSPITAL_BASED_OUTPATIENT_CLINIC_OR_DEPARTMENT_OTHER): Payer: HMO | Admitting: Anesthesiology

## 2021-07-06 ENCOUNTER — Encounter (HOSPITAL_BASED_OUTPATIENT_CLINIC_OR_DEPARTMENT_OTHER): Payer: Self-pay | Admitting: Orthopedic Surgery

## 2021-07-06 ENCOUNTER — Encounter (HOSPITAL_BASED_OUTPATIENT_CLINIC_OR_DEPARTMENT_OTHER)
Admission: RE | Disposition: A | Discharge status: Home / Self Care | Payer: Self-pay | Source: Home / Self Care | Attending: Orthopedic Surgery

## 2021-07-06 ENCOUNTER — Ambulatory Visit (HOSPITAL_BASED_OUTPATIENT_CLINIC_OR_DEPARTMENT_OTHER)
Admission: RE | Admit: 2021-07-06 | Discharge: 2021-07-06 | Disposition: A | Discharge status: Home / Self Care | Payer: HMO | Attending: Orthopedic Surgery | Admitting: Orthopedic Surgery

## 2021-07-06 ENCOUNTER — Other Ambulatory Visit: Payer: Self-pay

## 2021-07-06 DIAGNOSIS — Z8249 Family history of ischemic heart disease and other diseases of the circulatory system: Secondary | ICD-10-CM | POA: Insufficient documentation

## 2021-07-06 DIAGNOSIS — E119 Type 2 diabetes mellitus without complications: Secondary | ICD-10-CM | POA: Diagnosis not present

## 2021-07-06 DIAGNOSIS — I10 Essential (primary) hypertension: Secondary | ICD-10-CM | POA: Diagnosis not present

## 2021-07-06 DIAGNOSIS — Z87891 Personal history of nicotine dependence: Secondary | ICD-10-CM | POA: Diagnosis not present

## 2021-07-06 DIAGNOSIS — Z833 Family history of diabetes mellitus: Secondary | ICD-10-CM | POA: Diagnosis not present

## 2021-07-06 DIAGNOSIS — Z888 Allergy status to other drugs, medicaments and biological substances status: Secondary | ICD-10-CM | POA: Insufficient documentation

## 2021-07-06 DIAGNOSIS — Z91040 Latex allergy status: Secondary | ICD-10-CM | POA: Diagnosis not present

## 2021-07-06 DIAGNOSIS — Z90710 Acquired absence of both cervix and uterus: Secondary | ICD-10-CM | POA: Diagnosis not present

## 2021-07-06 DIAGNOSIS — M1811 Unilateral primary osteoarthritis of first carpometacarpal joint, right hand: Secondary | ICD-10-CM | POA: Diagnosis not present

## 2021-07-06 DIAGNOSIS — E785 Hyperlipidemia, unspecified: Secondary | ICD-10-CM | POA: Diagnosis not present

## 2021-07-06 DIAGNOSIS — M65331 Trigger finger, right middle finger: Secondary | ICD-10-CM | POA: Insufficient documentation

## 2021-07-06 DIAGNOSIS — Z885 Allergy status to narcotic agent status: Secondary | ICD-10-CM | POA: Insufficient documentation

## 2021-07-06 DIAGNOSIS — E559 Vitamin D deficiency, unspecified: Secondary | ICD-10-CM | POA: Diagnosis not present

## 2021-07-06 HISTORY — PX: CARPOMETACARPEL SUSPENSION PLASTY: SHX5005

## 2021-07-06 HISTORY — DX: Other complications of anesthesia, initial encounter: T88.59XA

## 2021-07-06 HISTORY — PX: TRIGGER FINGER RELEASE: SHX641

## 2021-07-06 LAB — BASIC METABOLIC PANEL
Anion gap: 9 (ref 5–15)
BUN: 15 mg/dL (ref 8–23)
CO2: 28 mmol/L (ref 22–32)
Calcium: 10.3 mg/dL (ref 8.9–10.3)
Chloride: 102 mmol/L (ref 98–111)
Creatinine, Ser: 0.83 mg/dL (ref 0.44–1.00)
GFR, Estimated: >60 mL/min (ref >60)
Glucose, Bld: 179 mg/dL — ABNORMAL HIGH (ref 70–99)
Potassium: 3.2 mmol/L — ABNORMAL LOW (ref 3.5–5.1)
Sodium: 139 mmol/L (ref 135–145)

## 2021-07-06 LAB — GLUCOSE, CAPILLARY
Glucose-Capillary: 177 mg/dL — ABNORMAL HIGH (ref 70–99)
Glucose-Capillary: 128 mg/dL — ABNORMAL HIGH (ref 70–99)

## 2021-07-06 SURGERY — CARPOMETACARPEL (CMC) SUSPENSION PLASTY
Anesthesia: General | Site: Hand | Laterality: Right

## 2021-07-06 MED ORDER — ONDANSETRON HCL 4 MG/2ML IJ SOLN
INTRAMUSCULAR | Status: AC
  Filled 2021-07-06: qty 2

## 2021-07-06 MED ORDER — DEXAMETHASONE SODIUM PHOSPHATE 10 MG/ML IJ SOLN
INTRAMUSCULAR | Status: AC
  Filled 2021-07-06: qty 1

## 2021-07-06 MED ORDER — OXYCODONE HCL 5 MG/5ML PO SOLN: 5.0000 mg | Freq: Once | ORAL | Status: AC | PRN

## 2021-07-06 MED ORDER — FENTANYL CITRATE (PF) 100 MCG/2ML IJ SOLN
INTRAMUSCULAR | Status: DC | PRN
  Administered 2021-07-06 (×4): 50 ug via INTRAVENOUS

## 2021-07-06 MED ORDER — LIDOCAINE 2% (20 MG/ML) 5 ML SYRINGE
INTRAMUSCULAR | Status: DC | PRN
  Administered 2021-07-06: 60 mg via INTRAVENOUS

## 2021-07-06 MED ORDER — OXYCODONE-ACETAMINOPHEN 5-325 MG PO TABS: ORAL_TABLET | ORAL | 0 refills | Status: DC

## 2021-07-06 MED ORDER — MIDAZOLAM HCL 2 MG/2ML IJ SOLN
INTRAMUSCULAR | Status: AC
  Filled 2021-07-06: qty 2

## 2021-07-06 MED ORDER — BUPIVACAINE HCL (PF) 0.25 % IJ SOLN
INTRAMUSCULAR | Status: DC | PRN
  Administered 2021-07-06: 10 mL

## 2021-07-06 MED ORDER — HYDROMORPHONE HCL 1 MG/ML IJ SOLN
INTRAMUSCULAR | Status: AC
  Filled 2021-07-06: qty 0.5

## 2021-07-06 MED ORDER — HYDROMORPHONE HCL 1 MG/ML IJ SOLN
0.2500 mg | INTRAMUSCULAR | Status: DC | PRN
  Administered 2021-07-06: 0.5 mg via INTRAVENOUS

## 2021-07-06 MED ORDER — OXYCODONE HCL 5 MG PO TABS
5.0000 mg | ORAL_TABLET | Freq: Once | ORAL | Status: AC | PRN
  Administered 2021-07-06: 5 mg via ORAL

## 2021-07-06 MED ORDER — LACTATED RINGERS IV SOLN: INTRAVENOUS | Status: DC

## 2021-07-06 MED ORDER — ONDANSETRON HCL 4 MG/2ML IJ SOLN
INTRAMUSCULAR | Status: DC | PRN
Start: 2021-07-06 — End: 2021-07-06
  Administered 2021-07-06: 4 mg via INTRAVENOUS

## 2021-07-06 MED ORDER — PROPOFOL 10 MG/ML IV BOLUS
INTRAVENOUS | Status: AC
  Filled 2021-07-06: qty 20

## 2021-07-06 MED ORDER — 0.9 % SODIUM CHLORIDE (POUR BTL) OPTIME
TOPICAL | Status: DC | PRN
  Administered 2021-07-06: 1000 mL

## 2021-07-06 MED ORDER — PROPOFOL 10 MG/ML IV BOLUS
INTRAVENOUS | Status: DC | PRN
  Administered 2021-07-06: 150 mg via INTRAVENOUS

## 2021-07-06 MED ORDER — DEXAMETHASONE SODIUM PHOSPHATE 10 MG/ML IJ SOLN
INTRAMUSCULAR | Status: DC | PRN
  Administered 2021-07-06: 4 mg via INTRAVENOUS

## 2021-07-06 MED ORDER — ONDANSETRON HCL 4 MG/2ML IJ SOLN: 4.0000 mg | Freq: Once | INTRAMUSCULAR | Status: DC | PRN

## 2021-07-06 MED ORDER — CEFAZOLIN SODIUM-DEXTROSE 2-4 GM/100ML-% IV SOLN
2.0000 g | INTRAVENOUS | Status: AC
  Administered 2021-07-06: 2 g via INTRAVENOUS

## 2021-07-06 MED ORDER — OXYCODONE HCL 5 MG PO TABS
ORAL_TABLET | ORAL | Status: AC
  Filled 2021-07-06: qty 1

## 2021-07-06 MED ORDER — FENTANYL CITRATE (PF) 100 MCG/2ML IJ SOLN
INTRAMUSCULAR | Status: AC
  Filled 2021-07-06: qty 2

## 2021-07-06 MED ORDER — CEFAZOLIN SODIUM-DEXTROSE 2-4 GM/100ML-% IV SOLN
INTRAVENOUS | Status: AC
  Filled 2021-07-06: qty 100

## 2021-07-06 MED ORDER — DIPHENHYDRAMINE HCL 50 MG/ML IJ SOLN
INTRAMUSCULAR | Status: AC
  Filled 2021-07-06: qty 1

## 2021-07-06 MED ORDER — LIDOCAINE HCL (PF) 2 % IJ SOLN
INTRAMUSCULAR | Status: AC
  Filled 2021-07-06: qty 5

## 2021-07-06 MED ORDER — DIPHENHYDRAMINE HCL 50 MG/ML IJ SOLN
12.5000 mg | Freq: Once | INTRAMUSCULAR | Status: AC
  Administered 2021-07-06: 12.5 mg via INTRAVENOUS

## 2021-07-06 SURGICAL SUPPLY — 65 items
APL PRP STRL LF DISP 70% ISPRP (MISCELLANEOUS) ×1
BIT DRILL PASSING CMC 1/4 FLEX (BIT) ×1 IMPLANT
BLADE MINI RND TIP GREEN BEAV (BLADE) ×2 IMPLANT
BLADE SURG 15 STRL LF DISP TIS (BLADE) ×2 IMPLANT
BLADE SURG 15 STRL SS (BLADE) ×4
BNDG CMPR 9X4 STRL LF SNTH (GAUZE/BANDAGES/DRESSINGS) ×1
BNDG COHESIVE 2X5 TAN ST LF (GAUZE/BANDAGES/DRESSINGS) ×2 IMPLANT
BNDG ELASTIC 2X5.8 VLCR STR LF (GAUZE/BANDAGES/DRESSINGS) IMPLANT
BNDG ELASTIC 3X5.8 VLCR STR LF (GAUZE/BANDAGES/DRESSINGS) IMPLANT
BNDG ESMARK 4X9 LF (GAUZE/BANDAGES/DRESSINGS) ×2 IMPLANT
BNDG GAUZE ELAST 4 BULKY (GAUZE/BANDAGES/DRESSINGS) ×2 IMPLANT
BUTTON ALL-SUT W/BACKSTOP (Orthopedic Implant) ×2 IMPLANT
CHLORAPREP W/TINT 26 (MISCELLANEOUS) ×2 IMPLANT
CORD BIPOLAR FORCEPS 12FT (ELECTRODE) ×2 IMPLANT
COVER BACK TABLE 60X90IN (DRAPES) ×2 IMPLANT
COVER MAYO STAND STRL (DRAPES) ×2 IMPLANT
CUFF TOURN SGL QUICK 18X4 (TOURNIQUET CUFF) ×2 IMPLANT
DRAPE EXTREMITY T 121X128X90 (DISPOSABLE) ×2 IMPLANT
DRAPE OEC MINIVIEW 54X84 (DRAPES) ×2 IMPLANT
DRAPE SURG 17X23 STRL (DRAPES) ×2 IMPLANT
DRILL PASSING CMC 1/4 FLEX (BIT) ×2
GAUZE 4X4 16PLY ~~LOC~~+RFID DBL (SPONGE) ×2 IMPLANT
GAUZE SPONGE 4X4 12PLY STRL (GAUZE/BANDAGES/DRESSINGS) ×2 IMPLANT
GAUZE XEROFORM 1X8 LF (GAUZE/BANDAGES/DRESSINGS) ×2 IMPLANT
GLOVE SRG 8 PF TXTR STRL LF DI (GLOVE) ×1 IMPLANT
GLOVE SURG ENC MOIS LTX SZ7.5 (GLOVE) ×2 IMPLANT
GLOVE SURG ORTHO LTX SZ8 (GLOVE) IMPLANT
GLOVE SURG UNDER POLY LF SZ8 (GLOVE) ×2
GLOVE SURG UNDER POLY LF SZ8.5 (GLOVE) IMPLANT
GOWN STRL REUS W/ TWL LRG LVL3 (GOWN DISPOSABLE) ×2 IMPLANT
GOWN STRL REUS W/TWL LRG LVL3 (GOWN DISPOSABLE) ×4
GOWN STRL REUS W/TWL XL LVL3 (GOWN DISPOSABLE) ×4 IMPLANT
NDL SAFETY ECLIPSE 18X1.5 (NEEDLE) IMPLANT
NEEDLE HYPO 18GX1.5 SHARP (NEEDLE)
NEEDLE HYPO 25X1 1.5 SAFETY (NEEDLE) ×2 IMPLANT
NEEDLE KEITH (NEEDLE) IMPLANT
NS IRRIG 1000ML POUR BTL (IV SOLUTION) ×2 IMPLANT
PACK BASIN DAY SURGERY FS (CUSTOM PROCEDURE TRAY) ×2 IMPLANT
PAD CAST 3X4 CTTN HI CHSV (CAST SUPPLIES) ×1 IMPLANT
PAD CAST 4YDX4 CTTN HI CHSV (CAST SUPPLIES) IMPLANT
PADDING CAST ABS 4INX4YD NS (CAST SUPPLIES) ×1
PADDING CAST ABS COTTON 4X4 ST (CAST SUPPLIES) ×1 IMPLANT
PADDING CAST COTTON 3X4 STRL (CAST SUPPLIES) ×2
PADDING CAST COTTON 4X4 STRL (CAST SUPPLIES)
SLEEVE SCD COMPRESS KNEE MED (STOCKING) ×2 IMPLANT
SLING ARM FOAM STRAP XLG (SOFTGOODS) ×2 IMPLANT
SPLINT FAST PLASTER 5X30 (CAST SUPPLIES)
SPLINT PLASTER CAST FAST 5X30 (CAST SUPPLIES) IMPLANT
SPLINT PLASTER CAST XFAST 3X15 (CAST SUPPLIES) ×20 IMPLANT
SPLINT PLASTER CAST XFAST 4X15 (CAST SUPPLIES) IMPLANT
SPLINT PLASTER XTRA FAST SET 4 (CAST SUPPLIES)
SPLINT PLASTER XTRA FASTSET 3X (CAST SUPPLIES) ×20
STOCKINETTE 4X48 STRL (DRAPES) ×2 IMPLANT
SUT ETHIBOND 3-0 V-5 (SUTURE) ×2 IMPLANT
SUT ETHILON 4 0 PS 2 18 (SUTURE) ×2 IMPLANT
SUT FIBERWIRE 2-0 18 17.9 3/8 (SUTURE)
SUT MERSILENE 2.0 SH NDLE (SUTURE) IMPLANT
SUT MERSILENE 4 0 P 3 (SUTURE) IMPLANT
SUT VIC AB 0 SH 27 (SUTURE) IMPLANT
SUT VICRYL 4-0 PS2 18IN ABS (SUTURE) ×2 IMPLANT
SUTURE FIBERWR 2-0 18 17.9 3/8 (SUTURE) IMPLANT
SYR BULB EAR ULCER 3OZ GRN STR (SYRINGE) ×2 IMPLANT
SYR CONTROL 10ML LL (SYRINGE) ×2 IMPLANT
TOWEL GREEN STERILE FF (TOWEL DISPOSABLE) ×4 IMPLANT
UNDERPAD 30X36 HEAVY ABSORB (UNDERPADS AND DIAPERS) ×2 IMPLANT

## 2021-07-06 NOTE — Discharge Instructions (Addendum)

## 2021-07-06 NOTE — Anesthesia Preprocedure Evaluation (Addendum)
Anesthesia Evaluation  Patient identified by MRN, date of birth, ID band Patient awake    Reviewed: Allergy & Precautions, NPO status , Patient's Chart, lab work & pertinent test results, reviewed documented beta blocker date and time   History of Anesthesia Complications (+) history of anesthetic complications  Airway Mallampati: II  TM Distance: >3 FB Neck ROM: Full    Dental  (+) Dental Advisory Given, Missing, Caps   Pulmonary asthma , former smoker,    Pulmonary exam normal breath sounds clear to auscultation       Cardiovascular hypertension, Pt. on medications and Pt. on home beta blockers Normal cardiovascular exam+ dysrhythmias Supra Ventricular Tachycardia  Rhythm:Regular Rate:Normal  EKG 04/13/21 NSR, LAD, LVH, anterior infarct  Hx/o Cardiogenic shock 05/2018 ? Related to ARB Rx   Neuro/Psych negative neurological ROS  negative psych ROS   GI/Hepatic GERD  Medicated and Controlled,Hx/o Shock liver 2019 when she was in cardiogenic shock   Endo/Other  diabetes, Poorly Controlled, Type 2, Oral Hypoglycemic AgentsHyperlipidemia Hypercalcemia- ?etiology Obesity  Renal/GU Renal InsufficiencyRenal disease  negative genitourinary   Musculoskeletal  (+) Arthritis , Osteoarthritis,  Arthritis right thumb and long finger   Abdominal (+) + obese,   Peds  Hematology negative hematology ROS (+)   Anesthesia Other Findings   Reproductive/Obstetrics                           Anesthesia Physical Anesthesia Plan  ASA: 3  Anesthesia Plan: General   Post-op Pain Management:    Induction: Intravenous  PONV Risk Score and Plan: 4 or greater and Treatment may vary due to age or medical condition and Ondansetron  Airway Management Planned: LMA  Additional Equipment:   Intra-op Plan:   Post-operative Plan: Extubation in OR  Informed Consent: I have reviewed the patients History and  Physical, chart, labs and discussed the procedure including the risks, benefits and alternatives for the proposed anesthesia with the patient or authorized representative who has indicated his/her understanding and acceptance.     Dental advisory given  Plan Discussed with: CRNA and Anesthesiologist  Anesthesia Plan Comments:         Anesthesia Quick Evaluation

## 2021-07-06 NOTE — Anesthesia Procedure Notes (Signed)
Procedure Name: LMA Insertion Date/Time: 07/06/2021 12:14 PM Performed by: Maryella Shivers, CRNA Pre-anesthesia Checklist: Patient identified, Emergency Drugs available, Suction available and Patient being monitored Patient Re-evaluated:Patient Re-evaluated prior to induction Oxygen Delivery Method: Circle system utilized Preoxygenation: Pre-oxygenation with 100% oxygen Induction Type: IV induction Ventilation: Mask ventilation without difficulty LMA: LMA inserted LMA Size: 4.0 Number of attempts: 1 Airway Equipment and Method: Bite block Placement Confirmation: positive ETCO2 Tube secured with: Tape Dental Injury: Teeth and Oropharynx as per pre-operative assessment

## 2021-07-06 NOTE — H&P (Signed)
Cathy Walker is an 70 y.o. female.   Chief Complaint: cmc arthritis HPI: 70 yo female with right thumb cmc arthritis and long finger trigger digit.  She wishes to have right long finger trigger release and right thumb trapeziectomy and suspensionplasty.  Allergies:  Allergies  Allergen Reactions   Angiotensin Receptor Blockers Cough   Codeine Itching   Latex Other (See Comments)    Blisters   Meperidine Other (See Comments)    HA    Exforge [Amlodipine Besylate-Valsartan] Palpitations   Sumatriptan Palpitations    Past Medical History:  Diagnosis Date   Acute hypoxemic respiratory failure (HCC) 06/24/2018   Aortic atherosclerosis ([place]) 02/26/2017   Bradycardia 04/18/2018   Cardiogenic shock ([place]) 123XX123   Complication of anesthesia    "caine" intolerance   Essential hypertension 04/18/2018   GERD (gastroesophageal reflux disease) 04/07/2018   HLD (hyperlipidemia) 04/07/2018   Hypercalcemia [id number]   Metabolic acidosis with respiratory acidosis 06/24/2018   Osteoarthritis of spine 02/21/2017   PSVT (paroxysmal supraventricular tachycardia) ([place]) 06/03/2018   Shock liver 06/24/2018   Type 2 diabetes mellitus without complication ([place]) 123XX123   Unspecified asthma, uncomplicated [id number]   Vitamin D deficiency 05/09/2016    Past Surgical History:  Procedure Laterality Date   ABDOMINAL HYSTERECTOMY     INSERT / REPLACE / REMOVE PACEMAKER     KNEE SURGERY Left     Family History: Family History  Problem Relation Age of Onset   Hypertension Mother    Hyperthyroidism Mother    AAA (abdominal aortic aneurysm) Mother    Diabetes Father    Hypertension Father    Heart attack Father    CAD Father    Stroke Father     Social History:   reports that she quit smoking about 37 years ago. Her smoking use included cigarettes. She has never used smokeless tobacco. She reports that she does not currently use drugs. She reports that she does not drink  alcohol.  Medications: No medications prior to admission.    No results found for this or any previous visit (from the past 48 hour(s)).  No results found.   A comprehensive review of systems was negative.  Height 5' (1.524 m), weight 77.1 kg.  General appearance: alert, cooperative, and appears stated age Head: Normocephalic, without obvious abnormality, atraumatic Neck: supple, symmetrical, trachea midline Cardio: regular rate and rhythm Resp: clear to auscultation bilaterally Extremities: Intact sensation and capillary refill all digits.  +epl/fpl/io.  No wounds.  Pulses: 2+ and symmetric Skin: Skin color, texture, turgor normal. No rashes or lesions Neurologic: Grossly normal Incision/Wound: none  Assessment/Plan Right thumb cmc arthritis and long finger trigger digit.  Non operative and operative treatment options have been discussed with the Cathy Walker and Cathy Walker wishes to proceed with operative treatment. Risks, benefits, and alternatives of surgery have been discussed and the Cathy Walker agrees with the plan of care.   Leanora Cover 07/06/2021, 8:40 AM

## 2021-07-06 NOTE — Op Note (Signed)
I assisted Surgeon(s) and Role:    * Leanora Cover, MD - Primary    * Daryll Brod, MD - Assisting on the Procedure(s): RIGHT THUMB TRAPEZIECTOMY  SUSPENSION PLASTY RELEASE TRIGGER FINGER/A-1 PULLEY RIGHT LONG TRIGGER on 07/06/2021.  I provided assistance on this case as follows: setup, approach, identification of the radial artery, identification of the trapezium, removal of the trapezium, harvest of the flexor carpiradialis, placement of the Microlink sutrure, transfer of the FCR tendon, closure of the wound and applicationof the dressings and splints  Electronically signed by: Daryll Brod, MD Date: 07/06/2021 Time: 1:28 PM

## 2021-07-06 NOTE — Op Note (Signed)
NAME: Cathy Walker MEDICAL RECORD NO: [id number] DATE OF BIRTH: May 27, 1951 FACILITY: [facility] LOCATION: [facility] PHYSICIAN: Tennis Must, MD   OPERATIVE REPORT   DATE OF PROCEDURE: 07/06/21    PREOPERATIVE DIAGNOSIS: Right thumb CMC osteoarthritis and right long finger trigger digit   POSTOPERATIVE DIAGNOSIS: Right thumb CMC osteoarthritis and right long finger trigger digit   PROCEDURE: 1.  Right thumb trapeziectomy 2.  Right thumb suspensioplasty 3.  Right long finger trigger release   SURGEON:  Leanora Cover, M.D.   ASSISTANT: Daryll Brod, MD   ANESTHESIA:  General   INTRAVENOUS FLUIDS:  Per anesthesia flow sheet.   ESTIMATED BLOOD LOSS:  Minimal.   COMPLICATIONS:  None.   SPECIMENS:  none   TOURNIQUET TIME:    Total Tourniquet Time Documented: Forearm (Right) - 63 minutes Total: Forearm (Right) - 63 minutes    DISPOSITION:  Stable to PACU.   INDICATIONS: 70 year old female with CMC osteoarthritis of the right thumb and triggering of right long finger.  He has tried nonoperative treatment measures without lasting relief.  She wishes to proceed with right thumb trapeziectomy and suspensioplasty and right long finger trigger release.  Risks, benefits and alternatives of surgery were discussed including the risks of blood loss, infection, damage to nerves, vessels, tendons, ligaments, bone for surgery, need for additional surgery, complications with wound healing, continued pain, stiffness.  She voiced understanding of these risks and elected to proceed.  OPERATIVE COURSE:  After being identified preoperatively by myself,  the patient and I agreed on the procedure and site of the procedure.  The surgical site was marked.  Surgical consent had been signed. She was given IV antibiotics as preoperative antibiotic prophylaxis. She was transferred to the operating room and placed on the operating table in supine position with the right upper extremity on an arm  board.  General anesthesia was induced by the anesthesiologist. Right upper extremity was prepped and draped in normal sterile orthopedic fashion.  A surgical pause was performed between the surgeons, anesthesia, and operating room staff and all were in agreement as to the patient, procedure, and site of procedure.  Tourniquet at the proximal aspect of the extremity was inflated to 250 mmHg after exsanguination of the arm with an Esmarch bandage.  Incision was made at the volar aspect of the MP joint of the long finger.  This is carried in subcutaneous tissues by spreading technique.  The A1 pulley was identified and cleared of soft tissue coverage.  It was divided under direct visualization from proximal to distal.  The proximal 1 to 2 mm of the A2 pulley was divided to provide a funnel for the flexor tendons.  The tendons were brought through the wound and separated to release any adhesions.  The finger was placed range of motion there was no triggering.  Wound was copiously irrigated and closed with 4-0 nylon in a horizontal mattress fashion.  Incision was then made at the dorsum of the [facility] joint of the thumb.  This was carried in subcutaneous tissues by spreading technique.  Bipolar electrocautery was used to obtain hemostasis.  The interval between the APL and EPB tendons was made.  The deep branch of the radial artery was identified and protected throughout the case.  The joint capsule was sharply incised.  The trapezium was identified and confirmed with the C arm.  The trapezium was freed up with soft tissue attachments using the [facility] blade and freer elevator.  It was removed  with rongeurs.  The FCR tendon was placed under traction.  It was divided proximally to harvest one half of the tendon with a distally based stump.  The microlink suture was used.  The guidepin was placed from the radial side of the thumb metacarpal across the metacarpal and across the index finger metacarpal.  The C-arm was used in AP  lateral oblique projections to ensure appropriate position which was the case.  The microlink suture anchor was then placed.  An incision was made at the dorsum of the hand for retrieval of the guidepin and placement of the suture anchor over the index finger metacarpal.  A hemostat was placed between the thumb and index finger metacarpals to provide a small amount of space while tying the anchor over the index metacarpal.  The dorsal hand wound was copiously irrigated with sterile saline and closed with 4-0 nylon in a horizontal mattress fashion.  The FCR tendon was then passed through the APL tendon and sutured back to itself to provide a suspension for the thumb metacarpal.  Ethibond suture was used.  It was also sutured to the APL tendon where it was passing through.  The wound was then copiously irrigated with sterile saline.  C-arm was used in AP lateral oblique projections to ensure appropriate suspension of the thumb metacarpal which was the case.  The joint capsule was repaired as best possible over the trapeziectomy site with a 4-0 Vicryl suture.  In an inverted interrupted Vicryl suture was placed in subcutaneous tissues and the skin was closed with 4-0 nylon in a horizontal mattress fashion.  All wounds were injected with quarter percent plain Marcaine to aid in postoperative analgesia.  It was dressed with sterile Xeroform 4 x 4's and wrapped with a Kerlix bandage.  Thumb spica splint was placed and wrapped with Kerlix and Ace bandage.  Tourniquet was deflated at 63 minutes.  Fingertips were pink with brisk capillary refill after deflation of tourniquet.  The operative  drapes were broken down.  The patient was awoken from anesthesia safely.  She was transferred back to the stretcher and taken to PACU in stable condition.  I will see her back in the office in 1 week for postoperative followup.  I will give her a prescription for Percocet 5/325 1-2 tabs PO q6 hours prn pain, dispense # 30.   Leanora Cover, MD Electronically signed, 07/06/21

## 2021-07-06 NOTE — Transfer of Care (Signed)
Immediate Anesthesia Transfer of Care Note  Patient: Cathy Walker  Procedure(s) Performed: RIGHT THUMB TRAPEZIECTOMY  SUSPENSION PLASTY (Right: Hand) RELEASE TRIGGER FINGER/A-1 PULLEY RIGHT LONG TRIGGER (Right: Hand)  Patient Location: PACU  Anesthesia Type:General  Level of Consciousness: sedated  Airway & Oxygen Therapy: Patient Spontanous Breathing and Patient connected to face mask oxygen  Post-op Assessment: Report given to RN and Post -op Vital signs reviewed and stable  Post vital signs: Reviewed and stable  Last Vitals:  Vitals Value Taken Time  BP 145/78 07/06/21 1335  Temp    Pulse 81 07/06/21 1339  Resp 20 07/06/21 1339  SpO2 95 % 07/06/21 1339  Vitals shown include unvalidated device data.  Last Pain:  Vitals:   07/06/21 0937  TempSrc: Oral  PainSc: 0-No pain         Complications: No notable events documented.

## 2021-07-06 NOTE — Anesthesia Postprocedure Evaluation (Signed)
Anesthesia Post Note  Patient: Cathy Walker  Procedure(s) Performed: RIGHT THUMB TRAPEZIECTOMY  SUSPENSION PLASTY (Right: Hand) RELEASE TRIGGER FINGER/A-1 PULLEY RIGHT LONG TRIGGER (Right: Hand)     Patient location during evaluation: PACU Anesthesia Type: General Level of consciousness: sedated Pain management: pain level controlled Vital Signs Assessment: post-procedure vital signs reviewed and stable Respiratory status: spontaneous breathing and respiratory function stable Cardiovascular status: stable Postop Assessment: no apparent nausea or vomiting Anesthetic complications: no   No notable events documented.  Last Vitals:  Vitals:   07/06/21 1415 07/06/21 1420  BP: (!) 141/60   Pulse: 79   Resp: 14   Temp:    SpO2: 96% 94%    Last Pain:  Vitals:   07/06/21 1420  TempSrc:   PainSc: Asleep                 Merlinda Frederick

## 2021-07-07 ENCOUNTER — Encounter (HOSPITAL_BASED_OUTPATIENT_CLINIC_OR_DEPARTMENT_OTHER): Payer: Self-pay | Admitting: Orthopedic Surgery

## 2021-07-09 DIAGNOSIS — R062 Wheezing: Secondary | ICD-10-CM | POA: Diagnosis not present

## 2021-07-09 DIAGNOSIS — J189 Pneumonia, unspecified organism: Secondary | ICD-10-CM | POA: Diagnosis not present

## 2021-07-09 DIAGNOSIS — R0602 Shortness of breath: Secondary | ICD-10-CM | POA: Diagnosis not present

## 2021-07-13 DIAGNOSIS — R079 Chest pain, unspecified: Secondary | ICD-10-CM | POA: Diagnosis not present

## 2021-07-13 DIAGNOSIS — Z885 Allergy status to narcotic agent status: Secondary | ICD-10-CM | POA: Diagnosis not present

## 2021-07-13 DIAGNOSIS — R0603 Acute respiratory distress: Secondary | ICD-10-CM | POA: Diagnosis not present

## 2021-07-13 DIAGNOSIS — J189 Pneumonia, unspecified organism: Secondary | ICD-10-CM | POA: Diagnosis not present

## 2021-07-13 DIAGNOSIS — R0602 Shortness of breath: Secondary | ICD-10-CM | POA: Diagnosis not present

## 2021-07-13 DIAGNOSIS — R0600 Dyspnea, unspecified: Secondary | ICD-10-CM | POA: Diagnosis not present

## 2021-07-13 DIAGNOSIS — R000 Tachycardia, unspecified: Secondary | ICD-10-CM | POA: Diagnosis not present

## 2021-07-17 ENCOUNTER — Telehealth: Payer: Self-pay | Admitting: Cardiology

## 2021-07-17 NOTE — Telephone Encounter (Signed)
Patient states she was in the hospital in [facility] and would like to know if the office can request her CT and several EKG's that were done there. She states the hospital told her the office would need to request the records. Hospital number: [phone], her number: [phone]

## 2021-07-21 DIAGNOSIS — M19031 Primary osteoarthritis, right wrist: Secondary | ICD-10-CM | POA: Diagnosis not present

## 2021-07-21 DIAGNOSIS — M180 Bilateral primary osteoarthritis of first carpometacarpal joints: Secondary | ICD-10-CM | POA: Diagnosis not present

## 2021-07-21 DIAGNOSIS — M65331 Trigger finger, right middle finger: Secondary | ICD-10-CM | POA: Diagnosis not present

## 2021-07-24 NOTE — Telephone Encounter (Signed)
Called patient to ask her to come in to sign a ROI, pt states she is at the hospital getting hard copies of her records and will drop them by the office tomorrow.  07/24/21/kbl

## 2021-07-24 NOTE — Telephone Encounter (Signed)
Patient calling back to follow up. She says the hospital fax is: [phone]. She would like a call back when the information is requested. She says for the next day or 2 to call her at: [phone], after that she will be back at her home number: [phone]

## 2021-08-02 ENCOUNTER — Other Ambulatory Visit: Payer: Self-pay

## 2021-08-02 ENCOUNTER — Ambulatory Visit (INDEPENDENT_AMBULATORY_CARE_PROVIDER_SITE_OTHER): Payer: HMO | Admitting: Cardiology

## 2021-08-02 ENCOUNTER — Encounter: Payer: Self-pay | Admitting: Cardiology

## 2021-08-02 VITALS — BP 120/76 | HR 78 | Ht 60.0 in | Wt 171.0 lb

## 2021-08-02 DIAGNOSIS — I4892 Unspecified atrial flutter: Secondary | ICD-10-CM | POA: Diagnosis not present

## 2021-08-02 DIAGNOSIS — M6281 Muscle weakness (generalized): Secondary | ICD-10-CM | POA: Diagnosis not present

## 2021-08-02 DIAGNOSIS — M65331 Trigger finger, right middle finger: Secondary | ICD-10-CM | POA: Diagnosis not present

## 2021-08-02 DIAGNOSIS — I10 Essential (primary) hypertension: Secondary | ICD-10-CM | POA: Diagnosis not present

## 2021-08-02 DIAGNOSIS — I471 Supraventricular tachycardia: Secondary | ICD-10-CM | POA: Diagnosis not present

## 2021-08-02 DIAGNOSIS — M180 Bilateral primary osteoarthritis of first carpometacarpal joints: Secondary | ICD-10-CM | POA: Diagnosis not present

## 2021-08-02 DIAGNOSIS — M25641 Stiffness of right hand, not elsewhere classified: Secondary | ICD-10-CM | POA: Diagnosis not present

## 2021-08-02 NOTE — Progress Notes (Signed)
Cardiology Office Note:    Date:  08/02/2021   ID:  [name], DOB 04/30/1951, MRN [id number]  PCP:  Ernestene Kiel, MD  Cardiologist:  Shirlee More, MD    Referring MD: Ernestene Kiel, MD    ASSESSMENT:    1. Atrial flutter, unspecified type ([place])   2. Essential hypertension   3. PSVT (paroxysmal supraventricular tachycardia) (HCC)    PLAN:    In order of problems listed above:  On review of her EKG she clearly had transient atrial flutter with moderate ventricular response self-limited.  Occurred in the setting of pneumonia antibiotic therapy with Zithromax will be proarrhythmic at this time I would hold off on anticoagulation antiarrhythmic drug.  Fortunately she has the mobile Kardia device that we need to set up when asked her to record daily see many strips that say possible atrial fibrillation recheck an echocardiogram with the question of heart failure elevated BNP level and I will see back in my office in 1 month. Stable on current treatment Continue her beta-blocker   Next appointment: 1 month   Medication Adjustments/Labs and Tests Ordered: Current medicines are reviewed at length with the patient today.  Concerns regarding medicines are outlined above.  Orders Placed This Encounter  Procedures   EKG 12-Lead    No orders of the defined types were placed in this encounter.   Chief Complaint  Patient presents with   Hospitalization Follow-up    History of Present Illness:    Dajanai Breer is a 70 y.o. female with a hx of SVT, second-degree AV block and hypotension with combined therapy beta-blocker calcium channel blocker clonidine hypertension hyperlipidemia and type 2 diabetes last seen 04/13/2021.  On that visit she utilized an event monitor for 10 week showing rare ventricular ectopy with isolated PVCs and occasional supraventricular ectopy 3.2% burden she had no episodes of atrial fibrillation or flutter and she had rare episodes of [place] 1  second-degree AV block.  Compliance with diet, lifestyle and medications: Yes  She was at a urgent care visits where she was treated for heart failure with a diuretic treated for respiratory infection with antibiotics she had a CT of the chest performed which showed no findings of pulmonary embolism or pneumonia when seen in the emergency room in [place] and the chest x-ray also did not show heart failure.  Laboratory studies showed elevated proBNP level high-sensitivity troponin was low calcium elevated 10.9 potassium 3.5 sodium 139 GFR greater than 60 cc hemoglobin 12.0 white count 7100 platelets normal 308,000.  She tells me that the preceding chest x-ray showed pneumonia at urgent care. The note in the emergency room so the visit was for shortness of breath and treated for pneumonia on antibiotics and sporadically taking a diuretic.  She has no preceding history of heart failure.  All this after hand surgery under general anesthesia.  Clinically I suspect she had aspiration pneumonia was treated improved by the time she went to the emergency room and it seems like her atrial flutter was paroxysmal not particularly rapid and self-limited today she is in sinus rhythm.  No chest pain edema shortness of breath.  EKG today shows the same pattern of left axis deviation first-degree AV block consider old apical MI.  She has had previous evaluation echocardiogram and perfusion study showing no evidence of infarction or ischemia. Past Medical History:  Diagnosis Date   Acute hypoxemic respiratory failure ([place]) 06/24/2018   Aortic atherosclerosis ([place]) 02/26/2017   Bradycardia 04/18/2018  Cardiogenic shock ([place]) 123XX123   Complication of anesthesia    "caine" intolerance   Essential hypertension 04/18/2018   GERD (gastroesophageal reflux disease) 04/07/2018   HLD (hyperlipidemia) 04/07/2018   Hypercalcemia [id number]   Metabolic acidosis with respiratory acidosis 06/24/2018   Osteoarthritis  of spine 02/21/2017   PSVT (paroxysmal supraventricular tachycardia) ([place]) 06/03/2018   Shock liver 06/24/2018   Type 2 diabetes mellitus without complication ([place]) 123XX123   Unspecified asthma, uncomplicated [id number]   Vitamin D deficiency 05/09/2016    Past Surgical History:  Procedure Laterality Date   ABDOMINAL HYSTERECTOMY     CARPOMETACARPEL SUSPENSION PLASTY Right 07/06/2021   Procedure: RIGHT THUMB TRAPEZIECTOMY  SUSPENSION PLASTY;  Surgeon: Leanora Cover, MD;  Location: [place];  Service: Orthopedics;  Laterality: Right;   INSERT / REPLACE / REMOVE PACEMAKER     KNEE SURGERY Left    TRIGGER FINGER RELEASE Right 07/06/2021   Procedure: RELEASE TRIGGER FINGER/A-1 PULLEY RIGHT LONG TRIGGER;  Surgeon: Leanora Cover, MD;  Location: [place];  Service: Orthopedics;  Laterality: Right;    Current Medications: Current Meds  Medication Sig   acyclovir (ZOVIRAX) 400 MG tablet Take 400 mg by mouth as needed (rash).   albuterol (VENTOLIN HFA) 108 (90 Base) MCG/ACT inhaler Inhale 2 puffs into the lungs every 6 (six) hours as needed for wheezing or shortness of breath.   Albuterol Sulfate 2.5 MG/0.5ML NEBU Inhale 0.5 mLs into the lungs as needed (wheezing and shortness of breath).   ALPRAZolam (XANAX) 0.25 MG tablet Take 0.25 mg by mouth 2 (two) times daily as needed for anxiety.   Ascorbic Acid (VITAMIN C) 1000 MG tablet Take 1,000 mg by mouth daily.   aspirin EC 81 MG tablet Take 81 mg by mouth daily.   Biotin 10000 MCG TABS Take 1 tablet by mouth daily.   carvedilol (COREG) 25 MG tablet Take 0.5 tablets (12.5 mg total) by mouth 2 (two) times daily.   carvedilol (COREG) 6.25 MG tablet Take 6.25 mg by mouth 2 (two) times daily as needed (palpitations).   chlorthalidone (HYGROTON) 50 MG tablet Take 50 mg by mouth daily.   Cholecalciferol (D3) 50 MCG (2000 UT) TABS Take 2,000 Units by mouth daily.   DYMISTA 137-50 MCG/ACT SUSP INHALE 1 SPRAY IN EACH  NOSTRIL TWICE (2) DAILY   fenofibrate (TRICOR) 145 MG tablet Take 145 mg by mouth daily.   furosemide (LASIX) 40 MG tablet Take 40 mg by mouth daily as needed for fluid or edema.   glipiZIDE (GLUCOTROL XL) 10 MG 24 hr tablet Take 10 mg by mouth 2 (two) times daily.   hydrALAZINE (APRESOLINE) 25 MG tablet Take 25 mg by mouth 2 (two) times daily.   MAGNESIUM CHLORIDE-CALCIUM PO Take 2 tablets by mouth every morning. And take 1 tablet at night   meclizine (ANTIVERT) 25 MG tablet Take 25 mg by mouth as needed for dizziness.   montelukast (SINGULAIR) 10 MG tablet Take 10 mg by mouth at bedtime.   oxyCODONE-acetaminophen (PERCOCET) 5-325 MG tablet 1-2 tabs PO q6 hours prn pain   pioglitazone (ACTOS) 30 MG tablet Take 30 mg by mouth daily.   potassium chloride SA (KLOR-CON) 20 MEQ tablet Take 20 mEq by mouth 2 (two) times daily.   Semaglutide,0.25 or 0.'5MG'$ /DOS, (OZEMPIC, 0.25 OR 0.5 MG/DOSE,) 2 MG/1.5ML SOPN Inject 0.5 mg into the skin once a week.   temazepam (RESTORIL) 30 MG capsule Take 30 mg by mouth at bedtime as needed for sleep.  tiZANidine (ZANAFLEX) 4 MG tablet Take 4 mg by mouth as needed for muscle spasms.   zinc gluconate 50 MG tablet Take 50 mg by mouth daily.     Allergies:   Angiotensin receptor blockers, Codeine, Latex, Meperidine, Exforge [amlodipine besylate-valsartan], and Sumatriptan   Social History   Socioeconomic History   Marital status: Single    Spouse name: Not on file   Number of children: Not on file   Years of education: Not on file   Highest education level: Not on file  Occupational History   Not on file  Tobacco Use   Smoking status: Former    Types: Cigarettes    Quit date: 11/27/1983    Years since quitting: 37.7   Smokeless tobacco: Never  Vaping Use   Vaping Use: Never used  Substance and Sexual Activity   Alcohol use: Never   Drug use: Not Currently   Sexual activity: Not on file  Other Topics Concern   Not on file  Social History Narrative    Not on file   Social Determinants of Health   Financial Resource Strain: Not on file  Food Insecurity: Not on file  Transportation Needs: Not on file  Physical Activity: Not on file  Stress: Not on file  Social Connections: Not on file     Family History: The patient's family history includes AAA (abdominal aortic aneurysm) in her mother; CAD in her father; Diabetes in her father; Heart attack in her father; Hypertension in her father and mother; Hyperthyroidism in her mother; Stroke in her father. ROS:   Please see the history of present illness.    All other systems reviewed and are negative.  EKGs/Labs/Other Studies Reviewed:    The following studies were reviewed today:  EKG:  EKG ordered today and personally reviewed.  The ekg ordered today demonstrates sinus rhythm  Recent Labs: 07/06/2021: BUN 15; Creatinine, Ser 0.83; Potassium 3.2; Sodium 139  Recent Lipid Panel No results found for: CHOL, TRIG, HDL, CHOLHDL, VLDL, LDLCALC, LDLDIRECT  Physical Exam:    VS:  BP 120/76 (BP Location: Right Arm, Patient Position: Sitting, Cuff Size: Normal)   Pulse 78   Ht 5' (1.524 m)   Wt 171 lb (77.6 kg)   SpO2 94%   BMI 33.40 kg/m     Wt Readings from Last 3 Encounters:  08/02/21 171 lb (77.6 kg)  07/06/21 175 lb 4.3 oz (79.5 kg)  04/13/21 168 lb 12.8 oz (76.6 kg)     GEN:  Well nourished, well developed in no acute distress HEENT: Normal NECK: No JVD; No carotid bruits LYMPHATICS: No lymphadenopathy CARDIAC: RRR, no murmurs, rubs, gallops RESPIRATORY:  Clear to auscultation without rales, wheezing or rhonchi  ABDOMEN: Soft, non-tender, non-distended MUSCULOSKELETAL:  No edema; No deformity  SKIN: Warm and dry NEUROLOGIC:  Alert and oriented x 3 PSYCHIATRIC:  Normal affect    Signed, Shirlee More, MD  08/02/2021 4:20 PM    [facility]

## 2021-08-02 NOTE — Patient Instructions (Signed)
Medication Instructions:  Your physician recommends that you continue on your current medications as directed. Please refer to the Current Medication list given to you today.  *If you need a refill on your cardiac medications before your next appointment, please call your pharmacy*   Lab Work: None If you have labs (blood work) drawn today and your tests are completely normal, you will receive your results only by: [place] (if you have MyChart) OR A paper copy in the mail If you have any lab test that is abnormal or we need to change your treatment, we will call you to review the results.   Testing/Procedures: Your physician has requested that you have an echocardiogram. Echocardiography is a painless test that uses sound waves to create images of your heart. It provides your doctor with information about the size and shape of your heart and how well your heart's chambers and valves are working. This procedure takes approximately one hour. There are no restrictions for this procedure.    Follow-Up: At [facility], you and your health needs are our priority.  As part of our continuing mission to provide you with exceptional heart care, we have created designated Provider Care Teams.  These Care Teams include your primary Cardiologist (physician) and Advanced Practice Providers (APPs -  Physician Assistants and Nurse Practitioners) who all work together to provide you with the care you need, when you need it.  We recommend signing up for the patient portal called "MyChart".  Sign up information is provided on this After Visit Summary.  MyChart is used to connect with patients for Virtual Visits (Telemedicine).  Patients are able to view lab/test results, encounter notes, upcoming appointments, etc.  Non-urgent messages can be sent to your provider as well.   To learn more about what you can do with MyChart, go to [email or web address].    Your next appointment:   1  month(s)  The format for your next appointment:   In Person  Provider:   Dr. Bettina Gavia   Other Instructions

## 2021-08-07 ENCOUNTER — Ambulatory Visit: Payer: BC Managed Care – PPO | Admitting: Cardiology

## 2021-08-07 DIAGNOSIS — M6281 Muscle weakness (generalized): Secondary | ICD-10-CM | POA: Diagnosis not present

## 2021-08-07 DIAGNOSIS — M25641 Stiffness of right hand, not elsewhere classified: Secondary | ICD-10-CM | POA: Diagnosis not present

## 2021-08-07 DIAGNOSIS — M65331 Trigger finger, right middle finger: Secondary | ICD-10-CM | POA: Diagnosis not present

## 2021-08-07 DIAGNOSIS — M180 Bilateral primary osteoarthritis of first carpometacarpal joints: Secondary | ICD-10-CM | POA: Diagnosis not present

## 2021-08-09 DIAGNOSIS — M6281 Muscle weakness (generalized): Secondary | ICD-10-CM | POA: Diagnosis not present

## 2021-08-09 DIAGNOSIS — M65331 Trigger finger, right middle finger: Secondary | ICD-10-CM | POA: Diagnosis not present

## 2021-08-09 DIAGNOSIS — M25641 Stiffness of right hand, not elsewhere classified: Secondary | ICD-10-CM | POA: Diagnosis not present

## 2021-08-09 DIAGNOSIS — M180 Bilateral primary osteoarthritis of first carpometacarpal joints: Secondary | ICD-10-CM | POA: Diagnosis not present

## 2021-08-14 DIAGNOSIS — D485 Neoplasm of uncertain behavior of skin: Secondary | ICD-10-CM | POA: Diagnosis not present

## 2021-08-14 DIAGNOSIS — L719 Rosacea, unspecified: Secondary | ICD-10-CM | POA: Diagnosis not present

## 2021-08-14 DIAGNOSIS — M65331 Trigger finger, right middle finger: Secondary | ICD-10-CM | POA: Diagnosis not present

## 2021-08-14 DIAGNOSIS — M25641 Stiffness of right hand, not elsewhere classified: Secondary | ICD-10-CM | POA: Diagnosis not present

## 2021-08-14 DIAGNOSIS — L820 Inflamed seborrheic keratosis: Secondary | ICD-10-CM | POA: Diagnosis not present

## 2021-08-14 DIAGNOSIS — M6281 Muscle weakness (generalized): Secondary | ICD-10-CM | POA: Diagnosis not present

## 2021-08-14 DIAGNOSIS — M180 Bilateral primary osteoarthritis of first carpometacarpal joints: Secondary | ICD-10-CM | POA: Diagnosis not present

## 2021-08-15 ENCOUNTER — Ambulatory Visit (INDEPENDENT_AMBULATORY_CARE_PROVIDER_SITE_OTHER): Payer: HMO

## 2021-08-15 ENCOUNTER — Other Ambulatory Visit: Payer: Self-pay

## 2021-08-15 DIAGNOSIS — I4892 Unspecified atrial flutter: Secondary | ICD-10-CM

## 2021-08-15 LAB — ECHOCARDIOGRAM COMPLETE
Area-P 1/2: 3.5 cm2
S' Lateral: 2.2 cm

## 2021-08-17 DIAGNOSIS — M6281 Muscle weakness (generalized): Secondary | ICD-10-CM | POA: Diagnosis not present

## 2021-08-17 DIAGNOSIS — M65331 Trigger finger, right middle finger: Secondary | ICD-10-CM | POA: Diagnosis not present

## 2021-08-17 DIAGNOSIS — M180 Bilateral primary osteoarthritis of first carpometacarpal joints: Secondary | ICD-10-CM | POA: Diagnosis not present

## 2021-08-17 DIAGNOSIS — M25641 Stiffness of right hand, not elsewhere classified: Secondary | ICD-10-CM | POA: Diagnosis not present

## 2021-08-21 DIAGNOSIS — M19031 Primary osteoarthritis, right wrist: Secondary | ICD-10-CM | POA: Diagnosis not present

## 2021-08-21 DIAGNOSIS — M65331 Trigger finger, right middle finger: Secondary | ICD-10-CM | POA: Diagnosis not present

## 2021-08-21 DIAGNOSIS — M6281 Muscle weakness (generalized): Secondary | ICD-10-CM | POA: Diagnosis not present

## 2021-08-21 DIAGNOSIS — M180 Bilateral primary osteoarthritis of first carpometacarpal joints: Secondary | ICD-10-CM | POA: Diagnosis not present

## 2021-08-21 DIAGNOSIS — M25641 Stiffness of right hand, not elsewhere classified: Secondary | ICD-10-CM | POA: Diagnosis not present

## 2021-08-24 DIAGNOSIS — R0789 Other chest pain: Secondary | ICD-10-CM | POA: Diagnosis not present

## 2021-08-24 DIAGNOSIS — E210 Primary hyperparathyroidism: Secondary | ICD-10-CM | POA: Diagnosis not present

## 2021-08-24 DIAGNOSIS — R002 Palpitations: Secondary | ICD-10-CM | POA: Diagnosis not present

## 2021-08-24 DIAGNOSIS — R0602 Shortness of breath: Secondary | ICD-10-CM | POA: Diagnosis not present

## 2021-08-24 DIAGNOSIS — R079 Chest pain, unspecified: Secondary | ICD-10-CM | POA: Diagnosis not present

## 2021-08-24 DIAGNOSIS — I249 Acute ischemic heart disease, unspecified: Secondary | ICD-10-CM | POA: Diagnosis not present

## 2021-08-24 DIAGNOSIS — M180 Bilateral primary osteoarthritis of first carpometacarpal joints: Secondary | ICD-10-CM | POA: Diagnosis not present

## 2021-08-24 DIAGNOSIS — I10 Essential (primary) hypertension: Secondary | ICD-10-CM | POA: Diagnosis not present

## 2021-08-24 DIAGNOSIS — I471 Supraventricular tachycardia: Secondary | ICD-10-CM | POA: Diagnosis not present

## 2021-08-24 DIAGNOSIS — I700 Atherosclerosis of aorta: Secondary | ICD-10-CM | POA: Diagnosis not present

## 2021-08-24 DIAGNOSIS — R110 Nausea: Secondary | ICD-10-CM | POA: Diagnosis not present

## 2021-08-24 DIAGNOSIS — M25641 Stiffness of right hand, not elsewhere classified: Secondary | ICD-10-CM | POA: Diagnosis not present

## 2021-08-24 DIAGNOSIS — M65331 Trigger finger, right middle finger: Secondary | ICD-10-CM | POA: Diagnosis not present

## 2021-08-24 DIAGNOSIS — I4892 Unspecified atrial flutter: Secondary | ICD-10-CM | POA: Diagnosis not present

## 2021-08-24 DIAGNOSIS — E785 Hyperlipidemia, unspecified: Secondary | ICD-10-CM | POA: Diagnosis not present

## 2021-08-24 DIAGNOSIS — E1169 Type 2 diabetes mellitus with other specified complication: Secondary | ICD-10-CM | POA: Diagnosis not present

## 2021-08-24 DIAGNOSIS — M6281 Muscle weakness (generalized): Secondary | ICD-10-CM | POA: Diagnosis not present

## 2021-08-24 DIAGNOSIS — R7989 Other specified abnormal findings of blood chemistry: Secondary | ICD-10-CM | POA: Diagnosis not present

## 2021-08-24 DIAGNOSIS — Z79899 Other long term (current) drug therapy: Secondary | ICD-10-CM | POA: Diagnosis not present

## 2021-08-25 DIAGNOSIS — I4892 Unspecified atrial flutter: Secondary | ICD-10-CM | POA: Diagnosis not present

## 2021-08-25 DIAGNOSIS — R7989 Other specified abnormal findings of blood chemistry: Secondary | ICD-10-CM | POA: Diagnosis not present

## 2021-08-25 DIAGNOSIS — R079 Chest pain, unspecified: Secondary | ICD-10-CM | POA: Diagnosis not present

## 2021-08-25 DIAGNOSIS — R0789 Other chest pain: Secondary | ICD-10-CM | POA: Diagnosis not present

## 2021-08-25 DIAGNOSIS — I10 Essential (primary) hypertension: Secondary | ICD-10-CM | POA: Diagnosis not present

## 2021-08-25 DIAGNOSIS — I471 Supraventricular tachycardia: Secondary | ICD-10-CM | POA: Diagnosis not present

## 2021-08-25 DIAGNOSIS — R0602 Shortness of breath: Secondary | ICD-10-CM | POA: Diagnosis not present

## 2021-08-25 DIAGNOSIS — R911 Solitary pulmonary nodule: Secondary | ICD-10-CM | POA: Diagnosis not present

## 2021-08-26 DIAGNOSIS — I440 Atrioventricular block, first degree: Secondary | ICD-10-CM | POA: Diagnosis not present

## 2021-08-26 DIAGNOSIS — K219 Gastro-esophageal reflux disease without esophagitis: Secondary | ICD-10-CM | POA: Diagnosis not present

## 2021-08-26 DIAGNOSIS — J45909 Unspecified asthma, uncomplicated: Secondary | ICD-10-CM | POA: Diagnosis not present

## 2021-08-26 DIAGNOSIS — Z79899 Other long term (current) drug therapy: Secondary | ICD-10-CM | POA: Diagnosis not present

## 2021-08-26 DIAGNOSIS — I4892 Unspecified atrial flutter: Secondary | ICD-10-CM | POA: Diagnosis not present

## 2021-08-26 DIAGNOSIS — Z881 Allergy status to other antibiotic agents status: Secondary | ICD-10-CM | POA: Diagnosis not present

## 2021-08-26 DIAGNOSIS — Z7901 Long term (current) use of anticoagulants: Secondary | ICD-10-CM | POA: Diagnosis not present

## 2021-08-26 DIAGNOSIS — Z885 Allergy status to narcotic agent status: Secondary | ICD-10-CM | POA: Diagnosis not present

## 2021-08-26 DIAGNOSIS — E119 Type 2 diabetes mellitus without complications: Secondary | ICD-10-CM | POA: Diagnosis not present

## 2021-08-26 DIAGNOSIS — I471 Supraventricular tachycardia: Secondary | ICD-10-CM | POA: Diagnosis not present

## 2021-08-26 DIAGNOSIS — Z87891 Personal history of nicotine dependence: Secondary | ICD-10-CM | POA: Diagnosis not present

## 2021-08-26 DIAGNOSIS — I10 Essential (primary) hypertension: Secondary | ICD-10-CM | POA: Diagnosis not present

## 2021-08-26 DIAGNOSIS — F419 Anxiety disorder, unspecified: Secondary | ICD-10-CM | POA: Diagnosis not present

## 2021-08-26 DIAGNOSIS — R0789 Other chest pain: Secondary | ICD-10-CM | POA: Diagnosis not present

## 2021-08-26 DIAGNOSIS — R079 Chest pain, unspecified: Secondary | ICD-10-CM | POA: Diagnosis not present

## 2021-08-26 DIAGNOSIS — R7989 Other specified abnormal findings of blood chemistry: Secondary | ICD-10-CM | POA: Diagnosis not present

## 2021-08-26 DIAGNOSIS — Z8701 Personal history of pneumonia (recurrent): Secondary | ICD-10-CM | POA: Diagnosis not present

## 2021-08-27 ENCOUNTER — Encounter: Payer: Self-pay | Admitting: Cardiology

## 2021-08-27 DIAGNOSIS — I4892 Unspecified atrial flutter: Secondary | ICD-10-CM | POA: Diagnosis not present

## 2021-08-27 DIAGNOSIS — I440 Atrioventricular block, first degree: Secondary | ICD-10-CM | POA: Diagnosis not present

## 2021-08-27 DIAGNOSIS — I10 Essential (primary) hypertension: Secondary | ICD-10-CM | POA: Diagnosis not present

## 2021-08-27 DIAGNOSIS — R079 Chest pain, unspecified: Secondary | ICD-10-CM | POA: Diagnosis not present

## 2021-08-28 ENCOUNTER — Other Ambulatory Visit: Payer: Self-pay

## 2021-08-28 DIAGNOSIS — D0462 Carcinoma in situ of skin of left upper limb, including shoulder: Secondary | ICD-10-CM | POA: Diagnosis not present

## 2021-08-28 DIAGNOSIS — I471 Supraventricular tachycardia: Secondary | ICD-10-CM

## 2021-08-28 NOTE — Progress Notes (Signed)
VB

## 2021-08-29 DIAGNOSIS — T8859XA Other complications of anesthesia, initial encounter: Secondary | ICD-10-CM | POA: Insufficient documentation

## 2021-08-30 DIAGNOSIS — M6281 Muscle weakness (generalized): Secondary | ICD-10-CM | POA: Diagnosis not present

## 2021-08-30 DIAGNOSIS — D6869 Other thrombophilia: Secondary | ICD-10-CM | POA: Diagnosis not present

## 2021-08-30 DIAGNOSIS — M25552 Pain in left hip: Secondary | ICD-10-CM | POA: Diagnosis not present

## 2021-08-30 DIAGNOSIS — M180 Bilateral primary osteoarthritis of first carpometacarpal joints: Secondary | ICD-10-CM | POA: Diagnosis not present

## 2021-08-30 DIAGNOSIS — M65331 Trigger finger, right middle finger: Secondary | ICD-10-CM | POA: Diagnosis not present

## 2021-08-30 DIAGNOSIS — I480 Paroxysmal atrial fibrillation: Secondary | ICD-10-CM | POA: Diagnosis not present

## 2021-08-30 DIAGNOSIS — M25641 Stiffness of right hand, not elsewhere classified: Secondary | ICD-10-CM | POA: Diagnosis not present

## 2021-08-30 DIAGNOSIS — E1169 Type 2 diabetes mellitus with other specified complication: Secondary | ICD-10-CM | POA: Diagnosis not present

## 2021-09-01 ENCOUNTER — Other Ambulatory Visit: Payer: Self-pay

## 2021-09-01 ENCOUNTER — Ambulatory Visit (INDEPENDENT_AMBULATORY_CARE_PROVIDER_SITE_OTHER): Payer: HMO

## 2021-09-01 DIAGNOSIS — I10 Essential (primary) hypertension: Secondary | ICD-10-CM | POA: Diagnosis not present

## 2021-09-01 NOTE — Progress Notes (Signed)
Patient came in today for an EKG per Dr. Bettina Gavia. Dr. Bettina Gavia reviewed the EKG and stated that it was good. She will continue on her current medications at this time.

## 2021-09-04 DIAGNOSIS — M25641 Stiffness of right hand, not elsewhere classified: Secondary | ICD-10-CM | POA: Diagnosis not present

## 2021-09-04 DIAGNOSIS — M180 Bilateral primary osteoarthritis of first carpometacarpal joints: Secondary | ICD-10-CM | POA: Diagnosis not present

## 2021-09-04 DIAGNOSIS — M65331 Trigger finger, right middle finger: Secondary | ICD-10-CM | POA: Diagnosis not present

## 2021-09-04 DIAGNOSIS — M6281 Muscle weakness (generalized): Secondary | ICD-10-CM | POA: Diagnosis not present

## 2021-09-05 DIAGNOSIS — E876 Hypokalemia: Secondary | ICD-10-CM | POA: Diagnosis not present

## 2021-09-10 NOTE — Progress Notes (Signed)
Cardiology Office Note:    Date:  09/11/2021   ID:  Cathy Walker, DOB Jul 13, 1951, MRN [id number]  PCP:  Ernestene Kiel, MD  Cardiologist:  Shirlee More, MD    Referring MD: Ernestene Kiel, MD    ASSESSMENT:    1. Atypical atrial flutter ([place])   2. High risk medication use   3. Chronic anticoagulation   4. Essential hypertension    PLAN:    In order of problems listed above:  Fortunately capture urinary atypical atrial flutter when she was in the hospital I placed her on flecainide continue the beta-blocker but she is continues to have breakthrough episodes and referred to EP both with atrial flutter as well as a previous severe bradycardia with compliance suppressive treatment. She gets home she will call she should be on 50 mg of flecainide twice daily Continue her anticoagulant Continue current antihypertensives   Next appointment: 8 weeks   Medication Adjustments/Labs and Tests Ordered: Current medicines are reviewed at length with the patient today.  Concerns regarding medicines are outlined above.  No orders of the defined types were placed in this encounter.  No orders of the defined types were placed in this encounter.   Chief Complaint  Patient presents with   Follow-up   Atrial Flutter    She is now on flecainide     History of Present Illness:    Cathy Walker is a 70 y.o. female with a hx of SVT second-degree AV block and hypotension with combined therapy beta-blocker calcium channel blocker and clonidine, hypertension hyperlipidemia and type 2 diabetes last seen by me in the office 08/02/2021.  Is recently met with [facility] seen by me 08/27/2021 after documentation of atypical atrial flutter initiated on antiarrhythmic therapy with flecainide and anticoagulated.  Compliance with diet, lifestyle and medications: Yes  We worked with her in the office one-on-one but she still is unable to capture episodes with the mobile cardia  device She drove to [facility] yesterday and for off-and-on for about an hour hour and a half she had intermittent rapid heart rhythm. She is unsure of her dose of flecainide) asked her to reconcile when she gets home she should be on 50 mg twice daily She tolerates her anticoagulant without any bleeding No shortness of breath edema or syncope  Echocardiogram performed 08/15/2021: Normal left ventricular size moderate LVH grade 1 diastolic function normal systolic function EF 60 to 65% Normal right ventricular size function and pulmonary artery pressure. Left and right atrium are normal in size No significant valvular abnormality.   Laboratory studies showed a hemoglobin 11.5 platelets 248,000 creatinine 0.6 sodium 136 potassium 4.0 serial troponins were checked undetectable hemoglobin A1c was elevated 8.0 She had a myocardial perfusion study done 08/26/2021 showing normal perfusion and function . Past Medical History:  Diagnosis Date   Acute hypoxemic respiratory failure ([place]) 06/24/2018   Aortic atherosclerosis ([place]) 02/26/2017   Bradycardia 04/18/2018   Cardiogenic shock ([place]) 02/58/5277   Complication of anesthesia    "caine" intolerance   Essential hypertension 04/18/2018   GERD (gastroesophageal reflux disease) 04/07/2018   HLD (hyperlipidemia) 04/07/2018   Hypercalcemia 82/42/3536   Metabolic acidosis with respiratory acidosis 06/24/2018   Osteoarthritis of spine 02/21/2017   PSVT (paroxysmal supraventricular tachycardia) ([place]) 06/03/2018   Shock liver 06/24/2018   Type 2 diabetes mellitus without complication ([place]) 14/43/1540   Unspecified asthma, uncomplicated 08/67/6195   Vitamin D deficiency 05/09/2016    Past Surgical History:  Procedure Laterality Date   ABDOMINAL  HYSTERECTOMY     CARPOMETACARPEL SUSPENSION PLASTY Right 07/06/2021   Procedure: RIGHT THUMB TRAPEZIECTOMY  SUSPENSION PLASTY;  Surgeon: Leanora Cover, MD;  Location: [place];  Service:  Orthopedics;  Laterality: Right;   INSERT / REPLACE / REMOVE PACEMAKER     KNEE SURGERY Left    TRIGGER FINGER RELEASE Right 07/06/2021   Procedure: RELEASE TRIGGER FINGER/A-1 PULLEY RIGHT LONG TRIGGER;  Surgeon: Leanora Cover, MD;  Location: [place];  Service: Orthopedics;  Laterality: Right;    Current Medications: Current Meds  Medication Sig   acyclovir (ZOVIRAX) 400 MG tablet Take 400 mg by mouth as needed (rash).   albuterol (VENTOLIN HFA) 108 (90 Base) MCG/ACT inhaler Inhale 2 puffs into the lungs every 6 (six) hours as needed for wheezing or shortness of breath.   Albuterol Sulfate 2.5 MG/0.5ML NEBU Inhale 0.5 mLs into the lungs as needed (wheezing and shortness of breath).   ALPRAZolam (XANAX) 0.25 MG tablet Take 0.25 mg by mouth 2 (two) times daily as needed for anxiety.   Ascorbic Acid (VITAMIN C) 1000 MG tablet Take 1,000 mg by mouth daily.   aspirin EC 81 MG tablet Take 81 mg by mouth daily.   Biotin 10000 MCG TABS Take 1 tablet by mouth daily.   carvedilol (COREG) 25 MG tablet Take 0.5 tablets (12.5 mg total) by mouth 2 (two) times daily.   carvedilol (COREG) 6.25 MG tablet Take 6.25 mg by mouth 2 (two) times daily as needed (palpitations).   chlorthalidone (HYGROTON) 50 MG tablet Take 50 mg by mouth daily.   Cholecalciferol (D3) 50 MCG (2000 UT) TABS Take 2,000 Units by mouth daily.   DYMISTA 137-50 MCG/ACT SUSP Inhale 1 puff into the lungs daily.   ELIQUIS 5 MG TABS tablet Take 5 mg by mouth 2 (two) times daily.   fenofibrate (TRICOR) 145 MG tablet Take 145 mg by mouth daily.   flecainide (TAMBOCOR) 100 MG tablet Take 100 mg by mouth 2 (two) times daily.   furosemide (LASIX) 40 MG tablet Take 40 mg by mouth daily as needed for fluid or edema.   glipiZIDE (GLUCOTROL XL) 10 MG 24 hr tablet Take 10 mg by mouth 2 (two) times daily.   hydrALAZINE (APRESOLINE) 25 MG tablet Take 25 mg by mouth 2 (two) times daily.   MAGNESIUM CHLORIDE-CALCIUM PO Take 1 tablet by  mouth See admin instructions. 1 tab in morning And take 1 tablet at night   meclizine (ANTIVERT) 25 MG tablet Take 25 mg by mouth as needed for dizziness.   montelukast (SINGULAIR) 10 MG tablet Take 10 mg by mouth at bedtime.   oxyCODONE-acetaminophen (PERCOCET) 5-325 MG tablet 1-2 tabs PO q6 hours prn pain (Patient taking differently: Take 1-2 tablets by mouth every 6 (six) hours as needed for moderate pain or severe pain. 1-2 tabs PO q6 hours prn pain)   pioglitazone (ACTOS) 30 MG tablet Take 30 mg by mouth daily.   potassium chloride SA (KLOR-CON) 20 MEQ tablet Take 20 mEq by mouth 2 (two) times daily.   Semaglutide,0.25 or 0.5MG/DOS, (OZEMPIC, 0.25 OR 0.5 MG/DOSE,) 2 MG/1.5ML SOPN Inject 0.5 mg into the skin once a week.   temazepam (RESTORIL) 30 MG capsule Take 30 mg by mouth at bedtime as needed for sleep.   tiZANidine (ZANAFLEX) 4 MG tablet Take 4 mg by mouth as needed for muscle spasms.   zinc gluconate 50 MG tablet Take 50 mg by mouth daily.     Allergies:  Angiotensin receptor blockers, Codeine, Latex, Meperidine, Zithromax [azithromycin], Exforge [amlodipine besylate-valsartan], and Sumatriptan   Social History   Socioeconomic History   Marital status: Single    Spouse name: Not on file   Number of children: Not on file   Years of education: Not on file   Highest education level: Not on file  Occupational History   Not on file  Tobacco Use   Smoking status: Former    Types: Cigarettes    Quit date: 11/27/1983    Years since quitting: 37.8   Smokeless tobacco: Never  Vaping Use   Vaping Use: Never used  Substance and Sexual Activity   Alcohol use: Never   Drug use: Not Currently   Sexual activity: Not on file  Other Topics Concern   Not on file  Social History Narrative   Not on file   Social Determinants of Health   Financial Resource Strain: Not on file  Food Insecurity: Not on file  Transportation Needs: Not on file  Physical Activity: Not on file  Stress:  Not on file  Social Connections: Not on file     Family History: The patient's family history includes AAA (abdominal aortic aneurysm) in her mother; CAD in her father; Diabetes in her father; Heart attack in her father; Hypertension in her father and mother; Hyperthyroidism in her mother; Stroke in her father. ROS:   Please see the history of present illness.    All other systems reviewed and are negative.  EKGs/Labs/Other Studies Reviewed:    The following studies were reviewed today:  EKG:  EKG ordered today and personally reviewed.  The ekg ordered today demonstrates sinus rhythm first-degree AV block QRS duration 91 ms    Physical Exam:    VS:  BP (!) 144/68 (BP Location: Right Arm, Patient Position: Sitting)   Pulse 76   Ht 5' (1.524 m)   Wt 173 lb (78.5 kg)   SpO2 98%   BMI 33.79 kg/m     Wt Readings from Last 3 Encounters:  09/11/21 173 lb (78.5 kg)  08/02/21 171 lb (77.6 kg)  07/06/21 175 lb 4.3 oz (79.5 kg)     GEN: She is quite unsettled by her recurrent rapid heart rhythm well nourished, well developed in no acute distress HEENT: Normal NECK: No JVD; No carotid bruits LYMPHATICS: No lymphadenopathy CARDIAC: RRR, no murmurs, rubs, gallops RESPIRATORY:  Clear to auscultation without rales, wheezing or rhonchi  ABDOMEN: Soft, non-tender, non-distended MUSCULOSKELETAL:  No edema; No deformity  SKIN: Warm and dry NEUROLOGIC:  Alert and oriented x 3 PSYCHIATRIC:  Normal affect    Signed, Shirlee More, MD  09/11/2021 11:20 AM    [place]

## 2021-09-11 ENCOUNTER — Other Ambulatory Visit: Payer: Self-pay

## 2021-09-11 ENCOUNTER — Encounter: Payer: Self-pay | Admitting: Cardiology

## 2021-09-11 ENCOUNTER — Ambulatory Visit (INDEPENDENT_AMBULATORY_CARE_PROVIDER_SITE_OTHER): Payer: HMO | Admitting: Cardiology

## 2021-09-11 VITALS — BP 144/68 | HR 76 | Ht 60.0 in | Wt 173.0 lb

## 2021-09-11 DIAGNOSIS — Z79899 Other long term (current) drug therapy: Secondary | ICD-10-CM | POA: Diagnosis not present

## 2021-09-11 DIAGNOSIS — I484 Atypical atrial flutter: Secondary | ICD-10-CM | POA: Diagnosis not present

## 2021-09-11 DIAGNOSIS — Z7901 Long term (current) use of anticoagulants: Secondary | ICD-10-CM | POA: Diagnosis not present

## 2021-09-11 DIAGNOSIS — I10 Essential (primary) hypertension: Secondary | ICD-10-CM | POA: Diagnosis not present

## 2021-09-11 NOTE — Patient Instructions (Signed)
Medication Instructions:  No medication changes. *If you need a refill on your cardiac medications before your next appointment, please call your pharmacy*   Lab Work: None ordered If you have labs (blood work) drawn today and your tests are completely normal, you will receive your results only by: [place] (if you have MyChart) OR A paper copy in the mail If you have any lab test that is abnormal or we need to change your treatment, we will call you to review the results.   Testing/Procedures: None ordered   Follow-Up: At [facility], you and your health needs are our priority.  As part of our continuing mission to provide you with exceptional heart care, we have created designated Provider Care Teams.  These Care Teams include your primary Cardiologist (physician) and Advanced Practice Providers (APPs -  Physician Assistants and Nurse Practitioners) who all work together to provide you with the care you need, when you need it.  We recommend signing up for the patient portal called "MyChart".  Sign up information is provided on this After Visit Summary.  MyChart is used to connect with patients for Virtual Visits (Telemedicine).  Patients are able to view lab/test results, encounter notes, upcoming appointments, etc.  Non-urgent messages can be sent to your provider as well.   To learn more about what you can do with MyChart, go to [email or web address].    Your next appointment:   6 week(s)  The format for your next appointment:   In Person  Provider:   Shirlee More, MD   Other Instructions  Call [phone] with your dose of Flecainide.

## 2021-09-20 ENCOUNTER — Ambulatory Visit (INDEPENDENT_AMBULATORY_CARE_PROVIDER_SITE_OTHER): Payer: HMO | Admitting: Cardiology

## 2021-09-20 ENCOUNTER — Encounter: Payer: Self-pay | Admitting: Cardiology

## 2021-09-20 ENCOUNTER — Other Ambulatory Visit: Payer: Self-pay

## 2021-09-20 VITALS — BP 140/80 | HR 85 | Ht 60.0 in | Wt 168.2 lb

## 2021-09-20 DIAGNOSIS — Z79899 Other long term (current) drug therapy: Secondary | ICD-10-CM

## 2021-09-20 DIAGNOSIS — Z01818 Encounter for other preprocedural examination: Secondary | ICD-10-CM | POA: Diagnosis not present

## 2021-09-20 DIAGNOSIS — I480 Paroxysmal atrial fibrillation: Secondary | ICD-10-CM

## 2021-09-20 DIAGNOSIS — I483 Typical atrial flutter: Secondary | ICD-10-CM | POA: Diagnosis not present

## 2021-09-20 DIAGNOSIS — Z01812 Encounter for preprocedural laboratory examination: Secondary | ICD-10-CM

## 2021-09-20 NOTE — Patient Instructions (Addendum)
Medication Instructions:  Your physician recommends that you continue on your current medications as directed. Please refer to the Current Medication list given to you today.  *If you need a refill on your cardiac medications before your next appointment, please call your pharmacy*   Lab Work: Pre procedure labs 11/29/2020:  BMP & CBC  If you have labs (blood work) drawn today and your tests are completely normal, you will receive your results only by: [place] (if you have MyChart) OR A paper copy in the mail If you have any lab test that is abnormal or we need to change your treatment, we will call you to review the results.   Testing/Procedures: Your physician has requested that you have cardiac CT within 7 days PRIOR to your ablation. Cardiac computed tomography (CT) is a painless test that uses an x-ray machine to take clear, detailed pictures of your heart.  Please follow instruction below located under "other instructions". You will get a call from our office to schedule the date for this test.  Your physician has recommended that you have an ablation. Catheter ablation is a medical procedure used to treat some cardiac arrhythmias (irregular heartbeats). During catheter ablation, a long, thin, flexible tube is put into a blood vessel in your groin (upper thigh), or neck. This tube is called an ablation catheter. It is then guided to your heart through the blood vessel. Radio frequency waves destroy small areas of heart tissue where abnormal heartbeats may cause an arrhythmia to start. Please follow instruction below located under "other instructions".   Follow-Up: At [facility], you and your health needs are our priority.  As part of our continuing mission to provide you with exceptional heart care, we have created designated Provider Care Teams.  These Care Teams include your primary Cardiologist (physician) and Advanced Practice Providers (APPs -  Physician Assistants and  Nurse Practitioners) who all work together to provide you with the care you need, when you need it.  Your next appointment:   1 month(s) after your ablation  The format for your next appointment:   In Person  Provider:   AFib clinic   Thank you for choosing [facility]!!   Trinidad Curet, RN [phone]    Other Instructions  .CT INSTRUCTIONS Your cardiac CT will be scheduled at:  [facility] [address] [place], [place] [address] [phone]  Please arrive at the [facility] main entrance of [facility] 30 minutes prior to test start time. Proceed to the [facility] Radiology Department (first floor) to check-in and test prep.   Please follow these instructions carefully (unless otherwise directed):  On the Night Before the Test: Be sure to Drink plenty of water. Do not consume any caffeinated/decaffeinated beverages or chocolate 12 hours prior to your test. Do not take any antihistamines 12 hours prior to your test.   On the Day of the Test: Drink plenty of water until 1 hour prior to the test. Do not eat any food 4 hours prior to the test. You may take your regular medications prior to the test.  Take an extra 6.25 mg Carvedilol two hours prior to test.  (So you would take a total of 12.5 mg the morning of this testing) HOLD Furosemide/Hydrochlorothiazide morning of the test. FEMALES- please wear underwire-free bra if available       After the Test: Drink plenty of water. After receiving IV contrast, you may experience a mild flushed feeling. This  is normal. On occasion, you may experience a mild rash up to 24 hours after the test. This is not dangerous. If this occurs, you can take Benadryl 25 mg and increase your fluid intake. If you experience trouble breathing, this can be serious. If it is severe call 911 IMMEDIATELY. If it is mild, please call our office. If you take any of these medications: Glipizide/Metformin, Avandament,  Glucavance, please do not take 48 hours after completing test unless otherwise instructed.   Once we have confirmed authorization from your insurance company, we will call you to set up a date and time for your test. Based on how quickly your insurance processes prior authorizations requests, please allow up to 4 weeks to be contacted for scheduling your Cardiac CT appointment. Be advised that routine Cardiac CT appointments could be scheduled as many as 8 weeks after your provider has ordered it.  For non-scheduling related questions, please contact the cardiac imaging nurse navigator should you have any questions/concerns: Marchia Bond, Cardiac Imaging Nurse Navigator Gordy Clement, Cardiac Imaging Nurse Navigator [facility] Direct Office Dial: [phone]   For scheduling needs, including cancellations and rescheduling, please call [place], [phone].     Electrophysiology/Ablation Procedure Instructions   You are scheduled for a(n)  ablation on 12/14/2020 with Dr. Quentin Ore.   1.   Pre procedure testing-             A.  LAB WORK --- On 11/29/2020 for your pre procedure blood work.  You can stop by the [facility] office anytime between 7:30 am - 4:30 pm.   You do NOT need to be fasting.   On the day of your procedure 12/14/2020 you will go to [facility] [id number]) at 5:30 am.  Dennis Bast will go to the main entrance A [employer]) and enter where the [employer] are.  Your driver will drop you off and you will head down the hallway to ADMITTING.  You may have one support person come in to the hospital with you.  They will be asked to wait in the waiting room. It is OK to have someone drop you off and come back when you are ready to be discharged.   3.   Do not eat or drink after midnight prior to your procedure.   4.   On the morning of your procedure do NOT take any medication. Do not miss any doses of your blood thinner prior to the  morning of your procedure or your procedure will need to be rescheduled.   5.  Plan for an overnight stay but you may be discharged after your procedure, if you use your phone frequently bring your phone charger. If you are discharged after your procedure you will need someone to drive you home and be with you for 24 hours after your procedure.   6. You will follow up with the AFIB clinic 4 weeks after your procedure.  You will follow up with Dr. Curt Bears  3 months after your procedure.  These appointments will be made for you.   7. FYI: For your safety, and to allow [place] to monitor your vital signs accurately during the surgery/procedure we request that if you have artificial nails, gel coating, SNS etc. Please have those removed prior to your surgery/procedure. Not having the nail coverings /polish removed may result in cancellation or delay of your surgery/procedure.  * If you have ANY questions please call the office (  [phone] and ask for Bing Neighbors or send me a MyChart message   * Occasionally, EP Studies and ablations can become lengthy.  Please make your family aware of this before your procedure starts.  Average time ranges from 2-8 hours for EP studies/ablations.  Your physician will call your family after the procedure with the results.                                    Cardiac Ablation Cardiac ablation is a procedure to destroy (ablate) some heart tissue that is sending bad signals. These bad signals cause problems in heart rhythm. The heart has many areas that make these signals. If there are problems in these areas, they can make the heart beat in a way that is not normal. Destroying some tissues can help make the heart rhythm normal. Tell your doctor about: Any allergies you have. All medicines you are taking. These include vitamins, herbs, eye drops, creams, and over-the-counter medicines. Any problems you or family members have had with medicines that make you fall asleep  (anesthetics). Any blood disorders you have. Any surgeries you have had. Any medical conditions you have, such as kidney failure. Whether you are pregnant or may be pregnant. What are the risks? This is a safe procedure. But problems may occur, including: Infection. Bruising and bleeding. Bleeding into the chest. Stroke or blood clots. Damage to nearby areas of your body. Allergies to medicines or dyes. The need for a pacemaker if the normal system is damaged. Failure of the procedure to treat the problem. What happens before the procedure? Medicines Ask your doctor about: Changing or stopping your normal medicines. This is important. Taking aspirin and ibuprofen. Do not take these medicines unless your doctor tells you to take them. Taking other medicines, vitamins, herbs, and supplements. General instructions Follow instructions from your doctor about what you cannot eat or drink. Plan to have someone take you home from the hospital or clinic. If you will be going home right after the procedure, plan to have someone with you for 24 hours. Ask your doctor what steps will be taken to prevent infection. What happens during the procedure?  An IV tube will be put into one of your veins. You will be given a medicine to help you relax. The skin on your neck or groin will be numbed. A cut (incision) will be made in your neck or groin. A needle will be put through your cut and into a large vein. A tube (catheter) will be put into the needle. The tube will be moved to your heart. Dye may be put through the tube. This helps your doctor see your heart. Small devices (electrodes) on the tube will send out signals. A type of energy will be used to destroy some heart tissue. The tube will be taken out. Pressure will be held on your cut. This helps stop bleeding. A bandage will be put over your cut. The exact procedure may vary among doctors and hospitals. What happens after the  procedure? You will be watched until you leave the hospital or clinic. This includes checking your heart rate, breathing rate, oxygen, and blood pressure. Your cut will be watched for bleeding. You will need to lie still for a few hours. Do not drive for 24 hours or as long as your doctor tells you. Summary Cardiac ablation is a procedure to destroy some heart  tissue. This is done to treat heart rhythm problems. Tell your doctor about any medical conditions you may have. Tell him or her about all medicines you are taking to treat them. This is a safe procedure. But problems may occur. These include infection, bruising, bleeding, and damage to nearby areas of your body. Follow what your doctor tells you about food and drink. You may also be told to change or stop some of your medicines. After the procedure, do not drive for 24 hours or as long as your doctor tells you. This information is not intended to replace advice given to you by your health care provider. Make sure you discuss any questions you have with your health care provider. Document Revised: 10/15/2019 Document Reviewed: 10/15/2019 Elsevier Patient Education  2022 [employer].

## 2021-09-20 NOTE — Progress Notes (Signed)
Electrophysiology Office Note:    Date:  09/20/2021   ID:  [name], DOB 1951-08-02, MRN [id number]  PCP:  Ernestene Kiel, MD  [facility] Cardiologist:  Shirlee More, MD  [facility] Electrophysiologist:  Vickie Epley, MD   Referring MD: Richardo Priest, MD   Chief Complaint: Atrial fibrillation and flutter  History of Present Illness:    Cathy Walker is a 70 y.o. female who presents for an evaluation of atrial fibrillation and flutter at the request of Dr. Bettina Gavia. Their medical history includes hypertension, GERD, hyperlipidemia, atrial fibrillation flutter and bradycardia.  The patient was last seen by Dr. Bettina Gavia on September 11, 2021.  At that appointment a history of second-degree AV block was noted.  She is currently on flecainide and carvedilol to help maintain normal rhythm.  She monitors her heart rhythm using a alive core monitor.  She brings in recordings today that clearly demonstrate atrial fibrillation.  These are highly symptomatic.  When she is having a bad day she will feel fatigued.  She tells me this is greatly impacting her quality of life.     Past Medical History:  Diagnosis Date   Acute hypoxemic respiratory failure ([place]) 06/24/2018   Aortic atherosclerosis ([place]) 02/26/2017   Bradycardia 04/18/2018   Cardiogenic shock ([place]) 56/31/4970   Complication of anesthesia    "caine" intolerance   Essential hypertension 04/18/2018   GERD (gastroesophageal reflux disease) 04/07/2018   HLD (hyperlipidemia) 04/07/2018   Hypercalcemia 26/37/8588   Metabolic acidosis with respiratory acidosis 06/24/2018   Osteoarthritis of spine 02/21/2017   PSVT (paroxysmal supraventricular tachycardia) ([place]) 06/03/2018   Shock liver 06/24/2018   Type 2 diabetes mellitus without complication ([place]) 50/27/7412   Unspecified asthma, uncomplicated 87/86/7672   Vitamin D deficiency 05/09/2016    Past Surgical History:  Procedure Laterality Date   ABDOMINAL  HYSTERECTOMY     CARPOMETACARPEL SUSPENSION PLASTY Right 07/06/2021   Procedure: RIGHT THUMB TRAPEZIECTOMY  SUSPENSION PLASTY;  Surgeon: Leanora Cover, MD;  Location: [place];  Service: Orthopedics;  Laterality: Right;   INSERT / REPLACE / REMOVE PACEMAKER     KNEE SURGERY Left    TRIGGER FINGER RELEASE Right 07/06/2021   Procedure: RELEASE TRIGGER FINGER/A-1 PULLEY RIGHT LONG TRIGGER;  Surgeon: Leanora Cover, MD;  Location: [place];  Service: Orthopedics;  Laterality: Right;    Current Medications: Current Meds  Medication Sig   acyclovir (ZOVIRAX) 400 MG tablet Take 400 mg by mouth as needed (rash).   albuterol (VENTOLIN HFA) 108 (90 Base) MCG/ACT inhaler Inhale 2 puffs into the lungs every 6 (six) hours as needed for wheezing or shortness of breath.   Albuterol Sulfate 2.5 MG/0.5ML NEBU Inhale 0.5 mLs into the lungs as needed (wheezing and shortness of breath).   ALPRAZolam (XANAX) 0.25 MG tablet Take 0.25 mg by mouth 2 (two) times daily as needed for anxiety.   Biotin 10000 MCG TABS Take 1 tablet by mouth daily.   carvedilol (COREG) 25 MG tablet Take 0.5 tablets (12.5 mg total) by mouth 2 (two) times daily.   carvedilol (COREG) 6.25 MG tablet Take 6.25 mg by mouth 2 (two) times daily as needed (palpitations).   chlorthalidone (HYGROTON) 25 MG tablet Take 25 mg by mouth daily.   Cholecalciferol (D3) 50 MCG (2000 UT) TABS Take 2,000 Units by mouth daily.   DYMISTA 137-50 MCG/ACT SUSP Inhale 1 puff into the lungs daily.   ELIQUIS 5 MG TABS tablet Take 5 mg by mouth  2 (two) times daily.   fenofibrate (TRICOR) 145 MG tablet Take 145 mg by mouth daily.   flecainide (TAMBOCOR) 100 MG tablet Take 50 mg by mouth 2 (two) times daily.   furosemide (LASIX) 40 MG tablet Take 40 mg by mouth daily as needed for fluid or edema.   glipiZIDE (GLUCOTROL XL) 10 MG 24 hr tablet Take 10 mg by mouth 2 (two) times daily.   hydrALAZINE (APRESOLINE) 25 MG tablet Take 25 mg by  mouth 2 (two) times daily.   MAGNESIUM CHLORIDE-CALCIUM PO Take 1 tablet by mouth See admin instructions. 1 tab in morning And take 1 tablet at night   meclizine (ANTIVERT) 25 MG tablet Take 25 mg by mouth as needed for dizziness.   montelukast (SINGULAIR) 10 MG tablet Take 10 mg by mouth at bedtime.   oxyCODONE-acetaminophen (PERCOCET) 5-325 MG tablet 1-2 tabs PO q6 hours prn pain   pioglitazone (ACTOS) 30 MG tablet Take 30 mg by mouth daily.   potassium chloride SA (KLOR-CON) 20 MEQ tablet Take 20 mEq by mouth 2 (two) times daily.   Semaglutide,0.25 or 0.5MG /DOS, (OZEMPIC, 0.25 OR 0.5 MG/DOSE,) 2 MG/1.5ML SOPN Inject 0.5 mg into the skin once a week.   temazepam (RESTORIL) 30 MG capsule Take 30 mg by mouth at bedtime as needed for sleep.   tiZANidine (ZANAFLEX) 4 MG tablet Take 4 mg by mouth as needed for muscle spasms.   zinc gluconate 50 MG tablet Take 50 mg by mouth daily.     Allergies:   Angiotensin receptor blockers, Codeine, Latex, Meperidine, Zithromax [azithromycin], Exforge [amlodipine besylate-valsartan], and Sumatriptan   Social History   Socioeconomic History   Marital status: Single    Spouse name: Not on file   Number of children: Not on file   Years of education: Not on file   Highest education level: Not on file  Occupational History   Not on file  Tobacco Use   Smoking status: Former    Types: Cigarettes    Quit date: 11/27/1983    Years since quitting: 37.8   Smokeless tobacco: Never  Vaping Use   Vaping Use: Never used  Substance and Sexual Activity   Alcohol use: Never   Drug use: Not Currently   Sexual activity: Not on file  Other Topics Concern   Not on file  Social History Narrative   Not on file   Social Determinants of Health   Financial Resource Strain: Not on file  Food Insecurity: Not on file  Transportation Needs: Not on file  Physical Activity: Not on file  Stress: Not on file  Social Connections: Not on file     Family History: The  patient's family history includes AAA (abdominal aortic aneurysm) in her mother; CAD in her father; Diabetes in her father; Heart attack in her father; Hypertension in her father and mother; Hyperthyroidism in her mother; Stroke in her father.  ROS:   Please see the history of present illness.    All other systems reviewed and are negative.  EKGs/Labs/Other Studies Reviewed:    The following studies were reviewed today:  07/13/2021 ECG   EKG:  The ekg ordered today demonstrates sinus rhythm.  PR interval of 226 ms.  QRS duration 92 ms.   Recent Labs: 07/06/2021: BUN 15; Creatinine, Ser 0.83; Potassium 3.2; Sodium 139  Recent Lipid Panel No results found for: CHOL, TRIG, HDL, CHOLHDL, VLDL, LDLCALC, LDLDIRECT  Physical Exam:    VS:  BP 140/80   Pulse  85   Ht 5' (1.524 m)   Wt 168 lb 3.2 oz (76.3 kg)   SpO2 93%   BMI 32.85 kg/m     Wt Readings from Last 3 Encounters:  09/20/21 168 lb 3.2 oz (76.3 kg)  09/11/21 173 lb (78.5 kg)  08/02/21 171 lb (77.6 kg)     GEN:  Well nourished, well developed in no acute distress HEENT: Normal NECK: No JVD; No carotid bruits LYMPHATICS: No lymphadenopathy CARDIAC: RRR, no murmurs, rubs, gallops RESPIRATORY:  Clear to auscultation without rales, wheezing or rhonchi  ABDOMEN: Soft, non-tender, non-distended MUSCULOSKELETAL:  No edema; No deformity  SKIN: Warm and dry NEUROLOGIC:  Alert and oriented x 3 PSYCHIATRIC:  Normal affect       ASSESSMENT:    1. Paroxysmal atrial fibrillation (HCC)   2. Typical atrial flutter ([place])   3. Pre-procedure lab exam    PLAN:    In order of problems listed above:  #Paroxysmal atrial fibrillation and typical atrial flutter Symptomatic.  On Eliquis for stroke prophylaxis.  We discussed the management options for her atrial arrhythmias including continued conservative management and stroke prophylaxis, rhythm control using antiarrhythmic drug therapy alternatives versus catheter ablation.  She  has mild PR prolongation on flecainide.  After discussion about the options the patient would like to pursue catheter ablation which I think is very reasonable.  I do not think she is a good candidate for additional antiarrhythmic drugs because of a history of AV conduction disease.  I discussed the ablation procedure in detail with the patient during today's visit include the risk, recovery and efficacy.  She would like to proceed with scheduling.   Risk, benefits, and alternatives to EP study and radiofrequency ablation for afib were also discussed in detail today. These risks include but are not limited to stroke, bleeding, vascular damage, tamponade, perforation, damage to the esophagus, lungs, and other structures, pulmonary vein stenosis, worsening renal function, and death. The patient understands these risk and wishes to proceed.  We will therefore proceed with catheter ablation at the next available time.  Carto, ICE, anesthesia are requested for the procedure.  Will also obtain CT PV protocol prior to the procedure to exclude LAA thrombus and further evaluate atrial anatomy.  Ablation strategy will be PVI plus CTI.      Total time spent with patient today 65 minutes. This includes reviewing records, evaluating the patient and coordinating care.  Medication Adjustments/Labs and Tests Ordered: Current medicines are reviewed at length with the patient today.  Concerns regarding medicines are outlined above.  Orders Placed This Encounter  Procedures   CT CARDIAC MORPH/PULM VEIN W/CM&W/O CA SCORE   Basic metabolic panel   CBC   EKG 12-Lead   No orders of the defined types were placed in this encounter.    Signed, Hilton Cork. Quentin Ore, MD, [facility], [facility] 09/20/2021 11:44 PM    Electrophysiology [facility]

## 2021-09-25 ENCOUNTER — Other Ambulatory Visit: Payer: Self-pay | Admitting: Cardiology

## 2021-09-25 MED ORDER — ELIQUIS 5 MG PO TABS
5.0000 mg | ORAL_TABLET | Freq: Two times a day (BID) | ORAL | 1 refills | Status: DC
Start: 2021-09-25 — End: 2021-10-25

## 2021-09-25 MED ORDER — FLECAINIDE ACETATE 100 MG PO TABS: 50.0000 mg | ORAL_TABLET | Freq: Two times a day (BID) | ORAL | 1 refills | Status: DC

## 2021-09-25 NOTE — Telephone Encounter (Signed)
Flecainide sent to pharmacy. Eliquis sent to AntiCoagulant pool

## 2021-09-25 NOTE — Telephone Encounter (Signed)
*  STAT* If patient is at the pharmacy, call can be transferred to refill team.   1. Which medications need to be refilled? (please list name of each medication and dose if known)  ELIQUIS 5 MG TABS tablet flecainide (TAMBOCOR) 100 MG tablet- patient takes 50 mg twice daily.   2. Which pharmacy/location (including street and city if local pharmacy) is medication to be sent to? [place], [place]  3. Do they need a 30 day or 90 day supply? 30 with refills  Patient is scheduled to see Dr. Bettina Gavia 10/25/21 but will not have enough medication to last her until her appointment

## 2021-09-25 NOTE — Telephone Encounter (Signed)
Prescription refill request for Eliquis received. Last office visit:[place] 09/20/21 Scr:0.83 07/06/21 Age: 2f Weight:76.3kg

## 2021-09-28 ENCOUNTER — Institutional Professional Consult (permissible substitution): Payer: HMO | Admitting: Cardiology

## 2021-10-02 ENCOUNTER — Telehealth: Payer: Self-pay | Admitting: Cardiology

## 2021-10-02 NOTE — Telephone Encounter (Signed)
Patient states she is scheduled to have lab work with PCP, Dr. Laqueta Due on 11/29/21. She would like to know if she can have lab work for 12/08/21 CT all at once, with PCP. If so, can orders be sent to their office? Please advise.

## 2021-10-02 NOTE — Telephone Encounter (Signed)
Pt aware will mail lab orders to pt and pt can have done at PCP office ./cy PCP will forward copy to our  office via fax.cy

## 2021-10-04 DIAGNOSIS — M1811 Unilateral primary osteoarthritis of first carpometacarpal joint, right hand: Secondary | ICD-10-CM | POA: Diagnosis not present

## 2021-10-24 ENCOUNTER — Telehealth: Payer: Self-pay | Admitting: Cardiology

## 2021-10-24 NOTE — Telephone Encounter (Signed)
Sent mychart message

## 2021-10-24 NOTE — Telephone Encounter (Signed)
New message   Pt has questions about ablation and would like call from RN.

## 2021-10-25 ENCOUNTER — Other Ambulatory Visit: Payer: Self-pay

## 2021-10-25 ENCOUNTER — Encounter: Payer: Self-pay | Admitting: Cardiology

## 2021-10-25 ENCOUNTER — Ambulatory Visit (INDEPENDENT_AMBULATORY_CARE_PROVIDER_SITE_OTHER): Payer: HMO | Admitting: Cardiology

## 2021-10-25 ENCOUNTER — Telehealth: Payer: Self-pay | Admitting: Cardiology

## 2021-10-25 VITALS — BP 144/84 | HR 73 | Ht 61.0 in | Wt 170.6 lb

## 2021-10-25 DIAGNOSIS — I10 Essential (primary) hypertension: Secondary | ICD-10-CM | POA: Diagnosis not present

## 2021-10-25 DIAGNOSIS — I484 Atypical atrial flutter: Secondary | ICD-10-CM | POA: Diagnosis not present

## 2021-10-25 DIAGNOSIS — Z79899 Other long term (current) drug therapy: Secondary | ICD-10-CM

## 2021-10-25 DIAGNOSIS — Z7901 Long term (current) use of anticoagulants: Secondary | ICD-10-CM | POA: Diagnosis not present

## 2021-10-25 DIAGNOSIS — I480 Paroxysmal atrial fibrillation: Secondary | ICD-10-CM

## 2021-10-25 MED ORDER — FLECAINIDE ACETATE 50 MG PO TABS: 50.0000 mg | ORAL_TABLET | Freq: Two times a day (BID) | ORAL | 3 refills | Status: DC

## 2021-10-25 MED ORDER — ELIQUIS 5 MG PO TABS: 5.0000 mg | ORAL_TABLET | Freq: Two times a day (BID) | ORAL | 3 refills | Status: DC

## 2021-10-25 MED ORDER — ELIQUIS 5 MG PO TABS
5.0000 mg | ORAL_TABLET | Freq: Two times a day (BID) | ORAL | 1 refills | Status: DC
Start: 2021-10-25 — End: 2021-10-25

## 2021-10-25 NOTE — Telephone Encounter (Signed)
Spoke to Cathy Walker just now and let her know that she can refill the one with 3 refills as the patient was concerned about only having one refill left. She verbalizes understanding.

## 2021-10-25 NOTE — Patient Instructions (Signed)

## 2021-10-25 NOTE — Telephone Encounter (Signed)
Patient request refills while at follow up appt with Dr Bettina Gavia for her Eliquis and Flecainide. She has been cutting her Flecainide 100 mg in half taking 50 mg twice daily but request to get 50 mg size tablets so she no longer needs to cut them. Rx's for both sent to [facility] pharmacy

## 2021-10-25 NOTE — Progress Notes (Signed)
Cardiology Office Note:    Date:  10/25/2021   ID:  [name], DOB 15-Sep-1951, MRN [id number]  PCP:  Ernestene Kiel, MD  Cardiologist:  Shirlee More, MD    Referring MD: Ernestene Kiel, MD    ASSESSMENT:    1. Paroxysmal atrial fibrillation (HCC)   2. Atypical atrial flutter (HCC)   3. High risk medication use   4. Chronic anticoagulation   5. Essential hypertension    PLAN:    In order of problems listed above:  She has a complex picture including previous high degree AV block with multiple suppressant agents SVT atypical atrial flutter and paroxysmal atrial fibrillation she will continue flecainide and is scheduled for EP catheter ablation.  Hopefully she will not require antiarrhythmic drug afterwards she asked if she needs to remain anticoagulated I told her to discuss that with EP. Stable blood pressure continue current medications   Next appointment: 6 months   Medication Adjustments/Labs and Tests Ordered: Current medicines are reviewed at length with the patient today.  Concerns regarding medicines are outlined above.  No orders of the defined types were placed in this encounter.  No orders of the defined types were placed in this encounter.   Chief Complaint  Patient presents with   Follow-up   Atrial Fibrillation    She is scheduled for EP catheter ablation for atrial fibrillation.   .[facility]  History of Present Illness:    Cathy Walker is a 70 y.o. female with a hx of paroxysmal atrial fibrillation and atypical atrial flutter on flecainide anticoagulated and hypertension last seen 09/11/2021.  She was referred to EP with recurrence and is scheduled for EP catheter ablation 12/14/2020.  Compliance with diet, lifestyle and medications: Yes  She had another episode I reviewed her mobile cardiac structure looks like atrial flutter with 2 1 conduction rate of 140 bpm. Continues on current anticoagulant no bleeding complication No angina  shortness of breath or syncope. She is looking forward to catheter ablation.  Echocardiogram performed 08/15/2021: Normal left ventricular size moderate LVH grade 1 diastolic function normal systolic function EF 60 to 65% Normal right ventricular size function and pulmonary artery pressure. Left and right atrium are normal in size No significant valvular abnormality.  Past Medical History:  Diagnosis Date   Acute hypoxemic respiratory failure ([place]) 06/24/2018   Aortic atherosclerosis ([place]) 02/26/2017   Bradycardia 04/18/2018   Cardiogenic shock ([place]) 56/81/2751   Complication of anesthesia    "caine" intolerance   Essential hypertension 04/18/2018   GERD (gastroesophageal reflux disease) 04/07/2018   HLD (hyperlipidemia) 04/07/2018   Hypercalcemia 70/11/7492   Metabolic acidosis with respiratory acidosis 06/24/2018   Osteoarthritis of spine 02/21/2017   PSVT (paroxysmal supraventricular tachycardia) ([place]) 06/03/2018   Shock liver 06/24/2018   Type 2 diabetes mellitus without complication ([place]) 49/67/5916   Unspecified asthma, uncomplicated 38/46/6599   Vitamin D deficiency 05/09/2016    Past Surgical History:  Procedure Laterality Date   ABDOMINAL HYSTERECTOMY     CARPOMETACARPEL SUSPENSION PLASTY Right 07/06/2021   Procedure: RIGHT THUMB TRAPEZIECTOMY  SUSPENSION PLASTY;  Surgeon: Leanora Cover, MD;  Location: [facility];  Service: Orthopedics;  Laterality: Right;   INSERT / REPLACE / REMOVE PACEMAKER     KNEE SURGERY Left    TRIGGER FINGER RELEASE Right 07/06/2021   Procedure: RELEASE TRIGGER FINGER/A-1 PULLEY RIGHT LONG TRIGGER;  Surgeon: Leanora Cover, MD;  Location: [facility];  Service: Orthopedics;  Laterality: Right;    Current Medications: Current  Meds  Medication Sig   acyclovir (ZOVIRAX) 400 MG tablet Take 400 mg by mouth as needed (rash).   albuterol (VENTOLIN HFA) 108 (90 Base) MCG/ACT inhaler Inhale 2 puffs into the lungs every 6  (six) hours as needed for wheezing or shortness of breath.   Albuterol Sulfate 2.5 MG/0.5ML NEBU Inhale 0.5 mLs into the lungs as needed (wheezing and shortness of breath).   ALPRAZolam (XANAX) 0.25 MG tablet Take 0.25 mg by mouth 2 (two) times daily as needed for anxiety.   Biotin 10000 MCG TABS Take 1 tablet by mouth daily.   carvedilol (COREG) 25 MG tablet Take 0.5 tablets (12.5 mg total) by mouth 2 (two) times daily.   carvedilol (COREG) 6.25 MG tablet Take 6.25 mg by mouth 2 (two) times daily as needed (palpitations).   chlorthalidone (HYGROTON) 25 MG tablet Take 25 mg by mouth daily.   Cholecalciferol (D3) 50 MCG (2000 UT) TABS Take 2,000 Units by mouth daily.   DYMISTA 137-50 MCG/ACT SUSP Inhale 1 puff into the lungs daily.   fenofibrate (TRICOR) 145 MG tablet Take 145 mg by mouth daily.   furosemide (LASIX) 40 MG tablet Take 40 mg by mouth daily as needed for fluid or edema.   glipiZIDE (GLUCOTROL XL) 10 MG 24 hr tablet Take 10 mg by mouth 2 (two) times daily.   hydrALAZINE (APRESOLINE) 25 MG tablet Take 25 mg by mouth 2 (two) times daily.   MAGNESIUM CHLORIDE-CALCIUM PO Take 1 tablet by mouth See admin instructions. 1 tab in morning And take 1 tablet at night   meclizine (ANTIVERT) 25 MG tablet Take 25 mg by mouth as needed for dizziness.   montelukast (SINGULAIR) 10 MG tablet Take 10 mg by mouth at bedtime.   pioglitazone (ACTOS) 30 MG tablet Take 30 mg by mouth daily.   potassium chloride SA (KLOR-CON) 20 MEQ tablet Take 20 mEq by mouth 2 (two) times daily.   Semaglutide,0.25 or 0.5MG /DOS, (OZEMPIC, 0.25 OR 0.5 MG/DOSE,) 2 MG/1.5ML SOPN Inject 0.5 mg into the skin once a week.   temazepam (RESTORIL) 30 MG capsule Take 30 mg by mouth at bedtime as needed for sleep.   zinc gluconate 50 MG tablet Take 50 mg by mouth daily.   [DISCONTINUED] ELIQUIS 5 MG TABS tablet Take 1 tablet (5 mg total) by mouth 2 (two) times daily.   [DISCONTINUED] flecainide (TAMBOCOR) 100 MG tablet Take 0.5  tablets (50 mg total) by mouth 2 (two) times daily.     Allergies:   Angiotensin receptor blockers, Codeine, Latex, Meperidine, Zithromax [azithromycin], Exforge [amlodipine besylate-valsartan], and Sumatriptan   Social History   Socioeconomic History   Marital status: Single    Spouse name: Not on file   Number of children: Not on file   Years of education: Not on file   Highest education level: Not on file  Occupational History   Not on file  Tobacco Use   Smoking status: Former    Types: Cigarettes    Quit date: 11/27/1983    Years since quitting: 37.9   Smokeless tobacco: Never  Vaping Use   Vaping Use: Never used  Substance and Sexual Activity   Alcohol use: Never   Drug use: Not Currently   Sexual activity: Not on file  Other Topics Concern   Not on file  Social History Narrative   Not on file   Social Determinants of Health   Financial Resource Strain: Not on file  Food Insecurity: Not on file  Transportation  Needs: Not on file  Physical Activity: Not on file  Stress: Not on file  Social Connections: Not on file     Family History: The patient's family history includes AAA (abdominal aortic aneurysm) in her mother; CAD in her father; Diabetes in her father; Heart attack in her father; Hypertension in her father and mother; Hyperthyroidism in her mother; Stroke in her father. ROS:   Please see the history of present illness.    All other systems reviewed and are negative.  EKGs/Labs/Other Studies Reviewed:    The following studies were reviewed today:  EKG:  EKG ordered today and personally reviewed.  The ekg ordered today demonstrates sinus rhythm first-degree AV block otherwise normal EKG  Recent Labs: 07/06/2021: BUN 15; Creatinine, Ser 0.83; Potassium 3.2; Sodium 139  Recent Lipid Panel No results found for: CHOL, TRIG, HDL, CHOLHDL, VLDL, LDLCALC, LDLDIRECT  Physical Exam:    VS:  BP (!) 144/84   Pulse 73   Ht 5\' 1"  (1.549 m)   Wt 170 lb 9.6 oz  (77.4 kg)   SpO2 95%   BMI 32.23 kg/m     Wt Readings from Last 3 Encounters:  10/25/21 170 lb 9.6 oz (77.4 kg)  09/20/21 168 lb 3.2 oz (76.3 kg)  09/11/21 173 lb (78.5 kg)     GEN:  Well nourished, well developed in no acute distress HEENT: Normal NECK: No JVD; No carotid bruits LYMPHATICS: No lymphadenopathy CARDIAC: RRR, no murmurs, rubs, gallops RESPIRATORY:  Clear to auscultation without rales, wheezing or rhonchi  ABDOMEN: Soft, non-tender, non-distended MUSCULOSKELETAL:  No edema; No deformity  SKIN: Warm and dry NEUROLOGIC:  Alert and oriented x 3 PSYCHIATRIC:  Normal affect    Signed, Shirlee More, MD  10/25/2021 1:08 PM    [facility]

## 2021-10-25 NOTE — Telephone Encounter (Signed)
Pt c/o medication issue:  1. Name of Medication: ELIQUIS 5 MG TABS tablet  2. How are you currently taking this medication (dosage and times per day)? 1 tablet twice a day  3. Are you having a reaction (difficulty breathing--STAT)? no  4. What is your medication issue? Almyra Free from [facility] states they received a prescription with 1 refill and another prescription with 3 refills. She would like a call back to know which one to fill. Phone: [phone]

## 2021-10-26 NOTE — Telephone Encounter (Signed)
Left detailed message requesting call back if any questions.

## 2021-10-30 NOTE — Telephone Encounter (Signed)
Follow Up:        Patient is returning Jenny's call from 10-26-21. She says she does have some questions.

## 2021-10-30 NOTE — Telephone Encounter (Signed)
Spoke with pt and had questions re CT questions answered  Pt has another question re when needs to start holding Eliquis prior to Ablation Pt aware will forward message to Myrtie Hawk RN to address and is aware nurse not here until 11/01/21 .Adonis Housekeeper

## 2021-10-31 DIAGNOSIS — M1712 Unilateral primary osteoarthritis, left knee: Secondary | ICD-10-CM | POA: Diagnosis not present

## 2021-10-31 DIAGNOSIS — M25552 Pain in left hip: Secondary | ICD-10-CM | POA: Diagnosis not present

## 2021-10-31 DIAGNOSIS — M79652 Pain in left thigh: Secondary | ICD-10-CM | POA: Diagnosis not present

## 2021-11-02 NOTE — Telephone Encounter (Signed)
Left detailed message for Pt advising she should NOT miss any doses of her blood thinner.  Advised the ONLY day she will NOT take any medication is the AM of her ablation.  Reprinted her instruction letters and will mail to Pt.  Advised to call back if any further questions.

## 2021-11-03 NOTE — Telephone Encounter (Signed)
Cathy Walker is returning Jennifer's call and is requesting a callback from her.

## 2021-11-03 NOTE — Telephone Encounter (Signed)
Returned call to Pt.  Reiterated Pt should not miss any doses of Eliquis prior to her ablation.  She is concerned because she has struggled with bleeding since she was a child.  Advised would let Dr. Quentin Ore know.  She was appreciative of phone call.

## 2021-11-30 ENCOUNTER — Other Ambulatory Visit: Payer: Self-pay

## 2021-11-30 ENCOUNTER — Other Ambulatory Visit: Payer: HMO | Admitting: *Deleted

## 2021-11-30 DIAGNOSIS — Z01812 Encounter for preprocedural laboratory examination: Secondary | ICD-10-CM | POA: Diagnosis not present

## 2021-11-30 DIAGNOSIS — W19XXXA Unspecified fall, initial encounter: Secondary | ICD-10-CM | POA: Diagnosis not present

## 2021-11-30 DIAGNOSIS — I480 Paroxysmal atrial fibrillation: Secondary | ICD-10-CM | POA: Diagnosis not present

## 2021-11-30 DIAGNOSIS — S8001XA Contusion of right knee, initial encounter: Secondary | ICD-10-CM | POA: Diagnosis not present

## 2021-11-30 DIAGNOSIS — S300XXA Contusion of lower back and pelvis, initial encounter: Secondary | ICD-10-CM | POA: Diagnosis not present

## 2021-11-30 LAB — CBC
Hematocrit: 39.8 % (ref 34.0–46.6)
Hemoglobin: 13.4 g/dL (ref 11.1–15.9)
MCH: 28.4 pg (ref 26.6–33.0)
MCHC: 33.7 g/dL (ref 31.5–35.7)
MCV: 84 fL (ref 79–97)
Platelets: 293 10*3/uL (ref 150–450)
RBC: 4.72 x10E6/uL (ref 3.77–5.28)
RDW: 14.6 % (ref 11.7–15.4)
WBC: 5.3 10*3/uL (ref 3.4–10.8)

## 2021-11-30 LAB — BASIC METABOLIC PANEL
BUN/Creatinine Ratio: 18 (ref 12–28)
BUN: 15 mg/dL (ref 8–27)
CO2: 27 mmol/L (ref 20–29)
Calcium: 11.3 mg/dL — ABNORMAL HIGH (ref 8.7–10.3)
Chloride: 102 mmol/L (ref 96–106)
Creatinine, Ser: 0.82 mg/dL (ref 0.57–1.00)
Glucose: 173 mg/dL — ABNORMAL HIGH (ref 70–99)
Potassium: 3.5 mmol/L (ref 3.5–5.2)
Sodium: 139 mmol/L (ref 134–144)
eGFR: 76 mL/min/{1.73_m2} (ref >59)

## 2021-12-04 ENCOUNTER — Telehealth: Payer: Self-pay | Admitting: Cardiology

## 2021-12-04 NOTE — Telephone Encounter (Signed)
Spoke with the patient who reports that she had a fall 12/11. She states that she fell over a tree stump. She has had to have wrist surgery. She also reports injury to her hip and back of her leg. She states that the bruising has almost completely healed however she still has a knot behind her right leg. She has been evaluated in urgent care however she states that they barely looked at her knot. She denies any swelling or redness in the leg. She just wanted to make [place] aware since she has an ablation scheduled for 1/19.

## 2021-12-04 NOTE — Telephone Encounter (Signed)
Patient is having an ablation done on 1/19. She had a fall last month, she still has a knot behind her right leg the size of a pea, and a knot on her left hip the size of 2 finger length wide & long.  She want to make [place] aware of this before her ablation was done. She wants to know if she needs to be seen by [place].

## 2021-12-05 NOTE — Telephone Encounter (Signed)
Dr. Quentin Ore made aware. Okay to proceed as planned

## 2021-12-07 ENCOUNTER — Telehealth (HOSPITAL_COMMUNITY): Payer: Self-pay | Admitting: *Deleted

## 2021-12-07 NOTE — Telephone Encounter (Signed)
Reaching out to patient to offer assistance regarding upcoming cardiac imaging study; pt verbalizes understanding of appt date/time, parking situation and where to check in, pre-test NPO status, and verified current allergies; name and call back number provided for further questions should they arise  Cathy Clement RN Navigator [facility] [phone] office [phone] cell  Patient aware to arrive at 1pm for her 1:30pm scan.

## 2021-12-08 ENCOUNTER — Other Ambulatory Visit: Payer: Self-pay

## 2021-12-08 ENCOUNTER — Ambulatory Visit (HOSPITAL_COMMUNITY)
Admission: RE | Admit: 2021-12-08 | Discharge: 2021-12-08 | Disposition: A | Discharge status: Home / Self Care | Payer: HMO | Source: Ambulatory Visit | Attending: Cardiology | Admitting: Cardiology

## 2021-12-08 DIAGNOSIS — I480 Paroxysmal atrial fibrillation: Secondary | ICD-10-CM | POA: Insufficient documentation

## 2021-12-08 MED ORDER — IOHEXOL 350 MG/ML SOLN
100.0000 mL | Freq: Once | INTRAVENOUS | Status: AC | PRN
  Administered 2021-12-08: 100 mL via INTRAVENOUS

## 2021-12-13 NOTE — Pre-Procedure Instructions (Signed)
Instructed patient on the following items: Arrival time 0530 Nothing to eat or drink after midnight No meds AM of procedure Responsible person to drive you home and stay with you for 24 hrs  Have you missed any doses of anti-coagulant Eliquis- hasn't missed any doses    

## 2021-12-13 NOTE — Anesthesia Preprocedure Evaluation (Addendum)
Anesthesia Evaluation  Patient identified by MRN, date of birth, ID band Patient awake    Reviewed: Allergy & Precautions, NPO status , Patient's Chart, lab work & pertinent test results  History of Anesthesia Complications Negative for: history of anesthetic complications  Airway Mallampati: II  TM Distance: >3 FB Neck ROM: Full    Dental  (+) Dental Advisory Given, Teeth Intact   Pulmonary asthma , sleep apnea (noncompliant) , former smoker,    Pulmonary exam normal        Cardiovascular hypertension, Pt. on medications and Pt. on home beta blockers + dysrhythmias Atrial Fibrillation and Supra Ventricular Tachycardia  Rhythm:Irregular Rate:Normal     Neuro/Psych negative neurological ROS  negative psych ROS   GI/Hepatic Neg liver ROS, GERD  Controlled,  Endo/Other  diabetes, Type 2, Oral Hypoglycemic Agents Obesity   Renal/GU negative Renal ROS     Musculoskeletal  (+) Arthritis ,   Abdominal   Peds  Hematology  On eliquis    Anesthesia Other Findings   Reproductive/Obstetrics                           Anesthesia Physical Anesthesia Plan  ASA: 3  Anesthesia Plan: General   Post-op Pain Management: Tylenol PO (pre-op)   Induction: Intravenous  PONV Risk Score and Plan: 3 and Treatment may vary due to age or medical condition, Ondansetron and Dexamethasone  Airway Management Planned: Oral ETT  Additional Equipment: None  Intra-op Plan:   Post-operative Plan: Extubation in OR  Informed Consent: I have reviewed the patients History and Physical, chart, labs and discussed the procedure including the risks, benefits and alternatives for the proposed anesthesia with the patient or authorized representative who has indicated his/her understanding and acceptance.     Dental advisory given  Plan Discussed with: CRNA and Anesthesiologist  Anesthesia Plan Comments:         Anesthesia Quick Evaluation

## 2021-12-14 ENCOUNTER — Telehealth: Payer: Self-pay | Admitting: Cardiology

## 2021-12-14 ENCOUNTER — Encounter (HOSPITAL_COMMUNITY)
Admission: RE | Disposition: A | Discharge status: Home / Self Care | Payer: HMO | Source: Home / Self Care | Attending: Cardiology

## 2021-12-14 ENCOUNTER — Other Ambulatory Visit: Payer: Self-pay | Admitting: Physician Assistant

## 2021-12-14 ENCOUNTER — Ambulatory Visit (HOSPITAL_COMMUNITY): Payer: HMO | Admitting: Certified Registered"

## 2021-12-14 ENCOUNTER — Other Ambulatory Visit: Payer: Self-pay

## 2021-12-14 ENCOUNTER — Ambulatory Visit (HOSPITAL_COMMUNITY)
Admission: RE | Admit: 2021-12-14 | Discharge: 2021-12-14 | Disposition: A | Discharge status: Home / Self Care | Payer: HMO | Attending: Cardiology | Admitting: Cardiology

## 2021-12-14 DIAGNOSIS — R001 Bradycardia, unspecified: Secondary | ICD-10-CM | POA: Insufficient documentation

## 2021-12-14 DIAGNOSIS — Z7901 Long term (current) use of anticoagulants: Secondary | ICD-10-CM | POA: Diagnosis not present

## 2021-12-14 DIAGNOSIS — I4891 Unspecified atrial fibrillation: Secondary | ICD-10-CM | POA: Diagnosis not present

## 2021-12-14 DIAGNOSIS — I483 Typical atrial flutter: Secondary | ICD-10-CM | POA: Insufficient documentation

## 2021-12-14 DIAGNOSIS — K219 Gastro-esophageal reflux disease without esophagitis: Secondary | ICD-10-CM | POA: Diagnosis not present

## 2021-12-14 DIAGNOSIS — I10 Essential (primary) hypertension: Secondary | ICD-10-CM | POA: Diagnosis not present

## 2021-12-14 DIAGNOSIS — I480 Paroxysmal atrial fibrillation: Secondary | ICD-10-CM | POA: Diagnosis not present

## 2021-12-14 DIAGNOSIS — E785 Hyperlipidemia, unspecified: Secondary | ICD-10-CM | POA: Insufficient documentation

## 2021-12-14 DIAGNOSIS — Z7984 Long term (current) use of oral hypoglycemic drugs: Secondary | ICD-10-CM | POA: Diagnosis not present

## 2021-12-14 DIAGNOSIS — E119 Type 2 diabetes mellitus without complications: Secondary | ICD-10-CM | POA: Diagnosis not present

## 2021-12-14 HISTORY — PX: ATRIAL FIBRILLATION ABLATION: EP1191

## 2021-12-14 LAB — GLUCOSE, CAPILLARY
Glucose-Capillary: 136 mg/dL — ABNORMAL HIGH (ref 70–99)
Glucose-Capillary: 180 mg/dL — ABNORMAL HIGH (ref 70–99)

## 2021-12-14 LAB — POCT ACTIVATED CLOTTING TIME
Activated Clotting Time: 323 seconds
Activated Clotting Time: 377 seconds
Activated Clotting Time: 305 seconds
Activated Clotting Time: 335 seconds

## 2021-12-14 SURGERY — ATRIAL FIBRILLATION ABLATION
Anesthesia: General

## 2021-12-14 MED ORDER — PANTOPRAZOLE SODIUM 40 MG PO TBEC: 40.0000 mg | DELAYED_RELEASE_TABLET | Freq: Every day | ORAL | 0 refills | Status: AC

## 2021-12-14 MED ORDER — HEPARIN (PORCINE) IN NACL 1000-0.9 UT/500ML-% IV SOLN
INTRAVENOUS | Status: AC
  Filled 2021-12-14: qty 2000

## 2021-12-14 MED ORDER — ACETAMINOPHEN 325 MG PO TABS: 650.0000 mg | ORAL_TABLET | ORAL | Status: DC | PRN

## 2021-12-14 MED ORDER — FENTANYL CITRATE (PF) 250 MCG/5ML IJ SOLN
INTRAMUSCULAR | Status: DC | PRN
  Administered 2021-12-14 (×2): 50 ug via INTRAVENOUS

## 2021-12-14 MED ORDER — SUCCINYLCHOLINE CHLORIDE 200 MG/10ML IV SOSY
PREFILLED_SYRINGE | INTRAVENOUS | Status: DC | PRN
  Administered 2021-12-14: 120 mg via INTRAVENOUS

## 2021-12-14 MED ORDER — PANTOPRAZOLE SODIUM 40 MG PO TBEC
40.0000 mg | DELAYED_RELEASE_TABLET | Freq: Every day | ORAL | Status: DC
  Administered 2021-12-14: 40 mg via ORAL
  Filled 2021-12-14: qty 1

## 2021-12-14 MED ORDER — LIDOCAINE 2% (20 MG/ML) 5 ML SYRINGE
INTRAMUSCULAR | Status: DC | PRN
Start: 2021-12-14 — End: 2021-12-14
  Administered 2021-12-14: 60 mg via INTRAVENOUS

## 2021-12-14 MED ORDER — SUGAMMADEX SODIUM 200 MG/2ML IV SOLN
INTRAVENOUS | Status: DC | PRN
  Administered 2021-12-14: 200 mg via INTRAVENOUS

## 2021-12-14 MED ORDER — HEPARIN SODIUM (PORCINE) 1000 UNIT/ML IJ SOLN
INTRAMUSCULAR | Status: AC
  Filled 2021-12-14: qty 10

## 2021-12-14 MED ORDER — SODIUM CHLORIDE 0.9 % IV SOLN
INTRAVENOUS | Status: DC
Start: 2021-12-14 — End: 2021-12-14

## 2021-12-14 MED ORDER — ISOPROTERENOL HCL 0.2 MG/ML IJ SOLN
INTRAVENOUS | Status: DC | PRN
  Administered 2021-12-14: 4 ug/min via INTRAVENOUS

## 2021-12-14 MED ORDER — HEPARIN SODIUM (PORCINE) 1000 UNIT/ML IJ SOLN
INTRAMUSCULAR | Status: DC | PRN
  Administered 2021-12-14: 1000 [IU] via INTRAVENOUS

## 2021-12-14 MED ORDER — SODIUM CHLORIDE 0.9 % IV SOLN: 250.0000 mL | INTRAVENOUS | Status: DC | PRN

## 2021-12-14 MED ORDER — PHENYLEPHRINE HCL-NACL 20-0.9 MG/250ML-% IV SOLN
INTRAVENOUS | Status: DC | PRN
  Administered 2021-12-14: 25 ug/min via INTRAVENOUS

## 2021-12-14 MED ORDER — PROTAMINE SULFATE 10 MG/ML IV SOLN
INTRAVENOUS | Status: DC | PRN
Start: 2021-12-14 — End: 2021-12-14
  Administered 2021-12-14: 35 mg via INTRAVENOUS

## 2021-12-14 MED ORDER — SODIUM CHLORIDE 0.9% FLUSH: 3.0000 mL | Freq: Two times a day (BID) | INTRAVENOUS | Status: DC

## 2021-12-14 MED ORDER — COLCHICINE 0.6 MG PO TABS: 0.6000 mg | ORAL_TABLET | Freq: Two times a day (BID) | ORAL | 0 refills | Status: DC

## 2021-12-14 MED ORDER — DEXAMETHASONE SODIUM PHOSPHATE 10 MG/ML IJ SOLN
INTRAMUSCULAR | Status: DC | PRN
  Administered 2021-12-14: 5 mg via INTRAVENOUS

## 2021-12-14 MED ORDER — ISOPROTERENOL HCL 0.2 MG/ML IJ SOLN
INTRAMUSCULAR | Status: AC
  Filled 2021-12-14: qty 5

## 2021-12-14 MED ORDER — SODIUM CHLORIDE 0.9% FLUSH: 3.0000 mL | INTRAVENOUS | Status: DC | PRN

## 2021-12-14 MED ORDER — APIXABAN 5 MG PO TABS
5.0000 mg | ORAL_TABLET | Freq: Two times a day (BID) | ORAL | Status: DC
  Administered 2021-12-14: 5 mg via ORAL
  Filled 2021-12-14: qty 1

## 2021-12-14 MED ORDER — ONDANSETRON HCL 4 MG/2ML IJ SOLN: 4.0000 mg | Freq: Four times a day (QID) | INTRAMUSCULAR | Status: DC | PRN

## 2021-12-14 MED ORDER — PROPOFOL 10 MG/ML IV BOLUS
INTRAVENOUS | Status: DC | PRN
Start: 2021-12-14 — End: 2021-12-14
  Administered 2021-12-14: 150 mg via INTRAVENOUS
  Administered 2021-12-14: 10 mg via INTRAVENOUS

## 2021-12-14 MED ORDER — ACETAMINOPHEN 500 MG PO TABS
1000.0000 mg | ORAL_TABLET | Freq: Once | ORAL | Status: AC
Start: 2021-12-14 — End: 2021-12-14
  Administered 2021-12-14: 1000 mg via ORAL
  Filled 2021-12-14: qty 2

## 2021-12-14 MED ORDER — ROCURONIUM BROMIDE 10 MG/ML (PF) SYRINGE
PREFILLED_SYRINGE | INTRAVENOUS | Status: DC | PRN
  Administered 2021-12-14: 10 mg via INTRAVENOUS
  Administered 2021-12-14: 50 mg via INTRAVENOUS

## 2021-12-14 MED ORDER — COLCHICINE 0.6 MG PO TABS
0.6000 mg | ORAL_TABLET | Freq: Two times a day (BID) | ORAL | Status: DC
  Administered 2021-12-14: 0.6 mg via ORAL
  Filled 2021-12-14: qty 1

## 2021-12-14 MED ORDER — HEPARIN SODIUM (PORCINE) 1000 UNIT/ML IJ SOLN
INTRAMUSCULAR | Status: DC | PRN
  Administered 2021-12-14: 11000 [IU] via INTRAVENOUS
  Administered 2021-12-14 (×2): 3000 [IU] via INTRAVENOUS

## 2021-12-14 MED ORDER — HEPARIN (PORCINE) IN NACL 1000-0.9 UT/500ML-% IV SOLN
INTRAVENOUS | Status: DC | PRN
  Administered 2021-12-14 (×4): 500 mL

## 2021-12-14 SURGICAL SUPPLY — 17 items
CABLE ADAPT PACING TEMP 12FT (ADAPTER) ×1 IMPLANT
CATH OCTARAY 2.0 F 3-3-3-3-3 (CATHETERS) ×2 IMPLANT
CATH S CIRCA THERM PROBE 10F (CATHETERS) ×1 IMPLANT
CATH SMTCH THERMOCOOL SF DF (CATHETERS) ×1 IMPLANT
CATH SOUNDSTAR ECO 8FR (CATHETERS) ×1 IMPLANT
CATH WEBSTER BI DIR CS D-F CRV (CATHETERS) ×1 IMPLANT
CLOSURE PERCLOSE PROSTYLE (VASCULAR PRODUCTS) ×3 IMPLANT
COVER SWIFTLINK CONNECTOR (BAG) ×2 IMPLANT
PACK EP LATEX FREE (CUSTOM PROCEDURE TRAY) ×2
PACK EP LF (CUSTOM PROCEDURE TRAY) ×1 IMPLANT
PAD DEFIB RADIO PHYSIO CONN (PAD) ×2 IMPLANT
SHEATH BAYLIS TRANSSEPTAL 98CM (NEEDLE) ×1 IMPLANT
SHEATH CARTO VIZIGO LRG CVD (SHEATH) ×1 IMPLANT
SHEATH PINNACLE 8F 10CM (SHEATH) ×2 IMPLANT
SHEATH PINNACLE 9F 10CM (SHEATH) ×1 IMPLANT
SHEATH PROBE COVER 6X72 (BAG) ×1 IMPLANT
TUBING SMART ABLATE COOLFLOW (TUBING) ×1 IMPLANT

## 2021-12-14 NOTE — Telephone Encounter (Signed)
Pt c/o medication issue:  1. Name of Medication: omeprazole (PRILOSEC) 40 MG capsule  2. How are you currently taking this medication (dosage and times per day)? Take 40 mg by mouth daily.  3. Are you having a reaction (difficulty breathing--STAT)? No   4. What is your medication issue? [facility] states that this pt just picked up omeprazole.. would like to know if they should hold the rx for pantoprazole until pt finishes the omeprazole, dispense both, or discontinue one or the other... please advise

## 2021-12-14 NOTE — Anesthesia Procedure Notes (Signed)
Procedure Name: Intubation Date/Time: 12/14/2021 7:44 AM Performed by: Gaylene Brooks, CRNA Pre-anesthesia Checklist: Patient identified, Emergency Drugs available, Suction available and Patient being monitored Patient Re-evaluated:Patient Re-evaluated prior to induction Oxygen Delivery Method: Circle System Utilized Preoxygenation: Pre-oxygenation with 100% oxygen Induction Type: IV induction, Rapid sequence and Cricoid Pressure applied Laryngoscope Size: Miller and 2 Grade View: Grade II Tube type: Oral Tube size: 7.0 mm Number of attempts: 1 Airway Equipment and Method: Stylet and Oral airway Placement Confirmation: ETT inserted through vocal cords under direct vision, positive ETCO2 and breath sounds checked- equal and bilateral Secured at: 21 cm Tube secured with: Tape Dental Injury: Teeth and Oropharynx as per pre-operative assessment  Comments: Pt described an aspiration pneumonia event with most recent LMA anesthetic. Decision made to perform RSI. No problems with induction or intubation. No evidence of aspiration.

## 2021-12-14 NOTE — Telephone Encounter (Signed)
Spoke to the pharmacy just now and let them know that neither of these were prescribed by Dr. Bettina Gavia. They are going to reach out to the prescribing physicians.

## 2021-12-14 NOTE — Discharge Instructions (Addendum)
Post procedure care instructions No driving for 4 days. No lifting over 5 lbs for 1 week. No vigorous or sexual activity for 1 week. You may return to work/your usual activities on 12/22/21. Keep procedure site clean & dry. If you notice increased pain, swelling, bleeding or pus, call/return!  You may shower after 24 hours, but no soaking in baths/hot tubs/pools for 1 week.     You have an appointment set up with the [facility].  Multiple studies have shown that being followed by a dedicated atrial fibrillation clinic in addition to the standard care you receive from your other physicians improves health. We believe that enrollment in the atrial fibrillation clinic will allow [place] to better care for you.   The phone number to the [facility] is [phone]. The clinic is staffed Monday through Friday from 8:30am to 5pm.  Parking Directions: The clinic is located in the Heart and Vascular Building connected to [facility]. 1)From [address] turn on to [address] and go to the 3rd entrance  (Heart and Vascular entrance) on the right. 2)Look to the right for Heart &Vascular Parking Garage. 3)A code for the entrance is required, for Feb is 1204.   4)Take the elevators to the 1st floor. Registration is in the room with the glass walls at the end of the hallway.  If you have any trouble parking or locating the clinic, please dont hesitate to call [phone].   Cardiac Ablation, Care After  This sheet gives you information about how to care for yourself after your procedure. Your health care provider may also give you more specific instructions. If you have problems or questions, contact your health care provider. What can I expect after the procedure? After the procedure, it is common to have: Bruising around your puncture site. Tenderness around your puncture site. Skipped heartbeats. Tiredness (fatigue).  Follow these instructions at  home: Puncture site care  Follow instructions from your health care provider about how to take care of your puncture site. Make sure you: If present, leave stitches (sutures), skin glue, or adhesive strips in place. These skin closures may need to stay in place for up to 2 weeks. If adhesive strip edges start to loosen and curl up, you may trim the loose edges. Do not remove adhesive strips completely unless your health care provider tells you to do that. If a large square bandage is present, this may be removed 24 hours after surgery.  Check your puncture site every day for signs of infection. Check for: Redness, swelling, or pain. Fluid or blood. If your puncture site starts to bleed, lie down on your back, apply firm pressure to the area, and contact your health care provider. Warmth. Pus or a bad smell. Driving Do not drive for at least 4 days after your procedure or however long your health care provider recommends. (Do not resume driving if you have previously been instructed not to drive for other health reasons.) Do not drive or use heavy machinery while taking prescription pain medicine. Activity Avoid activities that take a lot of effort for at least 7 days after your procedure. Do not lift anything that is heavier than 5 lb (4.5 kg) for one week.  No sexual activity for 1 week.  Return to your normal activities as told by your health care provider. Ask your health care provider what activities are safe for you. General instructions Take over-the-counter and prescription medicines only as told by your health  care provider. Do not use any products that contain nicotine or tobacco, such as cigarettes and e-cigarettes. If you need help quitting, ask your health care provider. You may shower after 24 hours, but Do not take baths, swim, or use a hot tub for 1 week.  Do not drink alcohol for 24 hours after your procedure. Keep all follow-up visits as told by your health care provider. This  is important. Contact a health care provider if: You have redness, mild swelling, or pain around your puncture site. You have fluid or blood coming from your puncture site that stops after applying firm pressure to the area. Your puncture site feels warm to the touch. You have pus or a bad smell coming from your puncture site. You have a fever. You have chest pain or discomfort that spreads to your neck, jaw, or arm. You are sweating a lot. You feel nauseous. You have a fast or irregular heartbeat. You have shortness of breath. You are dizzy or light-headed and feel the need to lie down. You have pain or numbness in the arm or leg closest to your puncture site. Get help right away if: Your puncture site suddenly swells. Your puncture site is bleeding and the bleeding does not stop after applying firm pressure to the area. These symptoms may represent a serious problem that is an emergency. Do not wait to see if the symptoms will go away. Get medical help right away. Call your local emergency services (911 in the [place] Do not drive yourself to the hospital. Summary After the procedure, it is normal to have bruising and tenderness at the puncture site in your groin, neck, or forearm. Check your puncture site every day for signs of infection. Get help right away if your puncture site is bleeding and the bleeding does not stop after applying firm pressure to the area. This is a medical emergency. This information is not intended to replace advice given to you by your health care provider. Make sure you discuss any questions you have with your health care provider.

## 2021-12-14 NOTE — Transfer of Care (Signed)
Immediate Anesthesia Transfer of Care Note  Patient: Cathy Walker  Procedure(s) Performed: ATRIAL FIBRILLATION ABLATION  Patient Location: Cath Lab  Anesthesia Type:General  Level of Consciousness: awake, alert  and oriented  Airway & Oxygen Therapy: Patient Spontanous Breathing and Patient connected to nasal cannula oxygen  Post-op Assessment: Report given to RN, Post -op Vital signs reviewed and stable and Patient moving all extremities X 4  Post vital signs: Reviewed and stable  Last Vitals:  Vitals Value Taken Time  BP 150/66 12/14/21 1156  Temp 36.7 C 12/14/21 1142  Pulse 82 12/14/21 1158  Resp 20 12/14/21 1158  SpO2 94 % 12/14/21 1158  Vitals shown include unvalidated device data.  Last Pain:  Vitals:   12/14/21 1156  TempSrc:   PainSc: 0-No pain      Patients Stated Pain Goal: 3 (11/91/47 8295)  Complications: No notable events documented.

## 2021-12-14 NOTE — Progress Notes (Signed)
Purewick placed, tolerated well, + void noted, light yellow in color, safety maintained

## 2021-12-14 NOTE — Anesthesia Postprocedure Evaluation (Signed)
Anesthesia Post Note  Patient: Margaretmary Prisk  Procedure(s) Performed: ATRIAL FIBRILLATION ABLATION     Patient location during evaluation: PACU Anesthesia Type: General Level of consciousness: awake and alert Pain management: pain level controlled Vital Signs Assessment: post-procedure vital signs reviewed and stable Respiratory status: spontaneous breathing, nonlabored ventilation and respiratory function stable Cardiovascular status: stable and blood pressure returned to baseline Anesthetic complications: no   No notable events documented.  Last Vitals:  Vitals:   12/14/21 1145 12/14/21 1156  BP: (!) 150/66 (!) 150/66  Pulse: 82 84  Resp: 15 16  Temp:    SpO2: 94% 92%    Last Pain:  Vitals:   12/14/21 1156  TempSrc:   PainSc: 0-No pain                 Audry Pili

## 2021-12-14 NOTE — H&P (Signed)
Electrophysiology Office Note:     Date:  12/14/2021    ID:  [name], DOB [birth year], MRN [id number]   PCP:  Ernestene Kiel, MD      [facility] Cardiologist:  Shirlee More, MD  [facility] Electrophysiologist:  Vickie Epley, MD    Referring MD: Richardo Priest, MD    Chief Complaint: Atrial fibrillation and flutter   History of Present Illness:     Cathy Walker is a 71 y.o. female who presents for an evaluation of atrial fibrillation and flutter at the request of Dr. Bettina Gavia. Their medical history includes hypertension, GERD, hyperlipidemia, atrial fibrillation flutter and bradycardia.  The patient was last seen by Dr. Bettina Gavia on September 11, 2021.  At that appointment a history of second-degree AV block was noted.  She is currently on flecainide and carvedilol to help maintain normal rhythm.  She monitors her heart rhythm using a alive core monitor.  She brings in recordings today that clearly demonstrate atrial fibrillation.  These are highly symptomatic.  When she is having a bad day she will feel fatigued.  She tells me this is greatly impacting her quality of life.   She has done well since I last saw her. Continues to have nearly daily episodes of AF.     Objective        Past Medical History:  Diagnosis Date   Acute hypoxemic respiratory failure ([place]) 06/24/2018   Aortic atherosclerosis ([place]) 02/26/2017   Bradycardia 04/18/2018   Cardiogenic shock ([place]) 46/27/0350   Complication of anesthesia      "caine" intolerance   Essential hypertension 04/18/2018   GERD (gastroesophageal reflux disease) 04/07/2018   HLD (hyperlipidemia) 04/07/2018   Hypercalcemia 09/38/1829   Metabolic acidosis with respiratory acidosis 06/24/2018   Osteoarthritis of spine 02/21/2017   PSVT (paroxysmal supraventricular tachycardia) ([place]) 06/03/2018   Shock liver 06/24/2018   Type 2 diabetes mellitus without complication ([place]) 93/71/6967   Unspecified asthma, uncomplicated  89/38/1017   Vitamin D deficiency 05/09/2016           Past Surgical History:  Procedure Laterality Date   ABDOMINAL HYSTERECTOMY       CARPOMETACARPEL SUSPENSION PLASTY Right 07/06/2021    Procedure: RIGHT THUMB TRAPEZIECTOMY  SUSPENSION PLASTY;  Surgeon: Leanora Cover, MD;  Location: [facility] [place];  Service: Orthopedics;  Laterality: Right;   INSERT / REPLACE / REMOVE PACEMAKER       KNEE SURGERY Left     TRIGGER FINGER RELEASE Right 07/06/2021    Procedure: RELEASE TRIGGER FINGER/A-1 PULLEY RIGHT LONG TRIGGER;  Surgeon: Leanora Cover, MD;  Location: [place];  Service: Orthopedics;  Laterality: Right;      Current Medications: Active Medications      Current Meds  Medication Sig   acyclovir (ZOVIRAX) 400 MG tablet Take 400 mg by mouth as needed (rash).   albuterol (VENTOLIN HFA) 108 (90 Base) MCG/ACT inhaler Inhale 2 puffs into the lungs every 6 (six) hours as needed for wheezing or shortness of breath.   Albuterol Sulfate 2.5 MG/0.5ML NEBU Inhale 0.5 mLs into the lungs as needed (wheezing and shortness of breath).   ALPRAZolam (XANAX) 0.25 MG tablet Take 0.25 mg by mouth 2 (two) times daily as needed for anxiety.   Biotin 10000 MCG TABS Take 1 tablet by mouth daily.   carvedilol (COREG) 25 MG tablet Take 0.5 tablets (12.5 mg total) by mouth 2 (two) times daily.   carvedilol (COREG) 6.25 MG tablet Take 6.25  mg by mouth 2 (two) times daily as needed (palpitations).   chlorthalidone (HYGROTON) 25 MG tablet Take 25 mg by mouth daily.   Cholecalciferol (D3) 50 MCG (2000 UT) TABS Take 2,000 Units by mouth daily.   DYMISTA 137-50 MCG/ACT SUSP Inhale 1 puff into the lungs daily.   ELIQUIS 5 MG TABS tablet Take 5 mg by mouth 2 (two) times daily.   fenofibrate (TRICOR) 145 MG tablet Take 145 mg by mouth daily.   flecainide (TAMBOCOR) 100 MG tablet Take 50 mg by mouth 2 (two) times daily.   furosemide (LASIX) 40 MG tablet Take 40 mg by mouth daily as needed for  fluid or edema.   glipiZIDE (GLUCOTROL XL) 10 MG 24 hr tablet Take 10 mg by mouth 2 (two) times daily.   hydrALAZINE (APRESOLINE) 25 MG tablet Take 25 mg by mouth 2 (two) times daily.   MAGNESIUM CHLORIDE-CALCIUM PO Take 1 tablet by mouth See admin instructions. 1 tab in morning And take 1 tablet at night   meclizine (ANTIVERT) 25 MG tablet Take 25 mg by mouth as needed for dizziness.   montelukast (SINGULAIR) 10 MG tablet Take 10 mg by mouth at bedtime.   oxyCODONE-acetaminophen (PERCOCET) 5-325 MG tablet 1-2 tabs PO q6 hours prn pain   pioglitazone (ACTOS) 30 MG tablet Take 30 mg by mouth daily.   potassium chloride SA (KLOR-CON) 20 MEQ tablet Take 20 mEq by mouth 2 (two) times daily.   Semaglutide,0.25 or 0.5MG /DOS, (OZEMPIC, 0.25 OR 0.5 MG/DOSE,) 2 MG/1.5ML SOPN Inject 0.5 mg into the skin once a week.   temazepam (RESTORIL) 30 MG capsule Take 30 mg by mouth at bedtime as needed for sleep.   tiZANidine (ZANAFLEX) 4 MG tablet Take 4 mg by mouth as needed for muscle spasms.   zinc gluconate 50 MG tablet Take 50 mg by mouth daily.        Allergies:   Angiotensin receptor blockers, Codeine, Latex, Meperidine, Zithromax [azithromycin], Exforge [amlodipine besylate-valsartan], and Sumatriptan    Social History         Socioeconomic History   Marital status: Single      Spouse name: Not on file   Number of children: Not on file   Years of education: Not on file   Highest education level: Not on file  Occupational History   Not on file  Tobacco Use   Smoking status: Former      Types: Cigarettes      Quit date: 11/27/1983      Years since quitting: 37.8   Smokeless tobacco: Never  Vaping Use   Vaping Use: Never used  Substance and Sexual Activity   Alcohol use: Never   Drug use: Not Currently   Sexual activity: Not on file  Other Topics Concern   Not on file  Social History Narrative   Not on file    Social Determinants of Health    Financial Resource Strain: Not on file   Food Insecurity: Not on file  Transportation Needs: Not on file  Physical Activity: Not on file  Stress: Not on file  Social Connections: Not on file      Family History: The patient's family history includes AAA (abdominal aortic aneurysm) in her mother; CAD in her father; Diabetes in her father; Heart attack in her father; Hypertension in her father and mother; Hyperthyroidism in her mother; Stroke in her father.   ROS:   Please see the history of present illness.    All other  systems reviewed and are negative.   EKGs/Labs/Other Studies Reviewed:     The following studies were reviewed today:   07/13/2021 ECG   EKG:  The ekg ordered today demonstrates sinus rhythm.  PR interval of 226 ms.  QRS duration 92 ms.     Recent Labs: 07/06/2021: BUN 15; Creatinine, Ser 0.83; Potassium 3.2; Sodium 139  Recent Lipid Panel Labs (Brief)  No results found for: CHOL, TRIG, HDL, CHOLHDL, VLDL, LDLCALC, LDLDIRECT     Physical Exam:     VS:  BP 140/80    Pulse 85    Ht 5' (1.524 m)    Wt 168 lb 3.2 oz (76.3 kg)    SpO2 93%    BMI 32.85 kg/m         Wt Readings from Last 3 Encounters:  09/20/21 168 lb 3.2 oz (76.3 kg)  09/11/21 173 lb (78.5 kg)  08/02/21 171 lb (77.6 kg)      GEN:  Well nourished, well developed in no acute distress HEENT: Normal NECK: No JVD; No carotid bruits LYMPHATICS: No lymphadenopathy CARDIAC: RRR, no murmurs, rubs, gallops RESPIRATORY:  Clear to auscultation without rales, wheezing or rhonchi  ABDOMEN: Soft, non-tender, non-distended MUSCULOSKELETAL:  No edema; No deformity  SKIN: Warm and dry NEUROLOGIC:  Alert and oriented x 3 PSYCHIATRIC:  Normal affect          Assessment     ASSESSMENT:     1. Paroxysmal atrial fibrillation (HCC)   2. Typical atrial flutter ([place])   3. Pre-procedure lab exam     PLAN:     In order of problems listed above:   #Paroxysmal atrial fibrillation and typical atrial flutter Symptomatic.  On Eliquis for  stroke prophylaxis.  We discussed the management options for her atrial arrhythmias including continued conservative management and stroke prophylaxis, rhythm control using antiarrhythmic drug therapy alternatives versus catheter ablation.  She has mild PR prolongation on flecainide.  After discussion about the options the patient would like to pursue catheter ablation which I think is very reasonable.  I do not think she is a good candidate for additional antiarrhythmic drugs because of a history of AV conduction disease.  I discussed the ablation procedure in detail with the patient during today's visit include the risk, recovery and efficacy.  She would like to proceed with scheduling.     Risk, benefits, and alternatives to EP study and radiofrequency ablation for afib were also discussed in detail today. These risks include but are not limited to stroke, bleeding, vascular damage, tamponade, perforation, damage to the esophagus, lungs, and other structures, pulmonary vein stenosis, worsening renal function, and death. The patient understands these risk and wishes to proceed.  We will therefore proceed with catheter ablation at the next available time.  Carto, ICE, anesthesia are requested for the procedure.  Will also obtain CT PV protocol prior to the procedure to exclude LAA thrombus and further evaluate atrial anatomy.   Ablation strategy will be PVI plus CTI.    --------------------------------- I have seen, examined the patient, and reviewed the above assessment and plan.    Plan for PVI + CTI today. Procedure reviewed.    Vickie Epley, MD 12/14/2021 7:12 AM

## 2021-12-14 NOTE — Progress Notes (Signed)
Pt ambulated without difficulty or bleeding.   Discharged home with her brother who will drive and stay with pt x 24 hrs.

## 2021-12-15 ENCOUNTER — Encounter (HOSPITAL_COMMUNITY): Payer: Self-pay | Admitting: Cardiology

## 2021-12-20 ENCOUNTER — Telehealth: Payer: Self-pay | Admitting: Cardiology

## 2021-12-20 NOTE — Telephone Encounter (Signed)
Called patient to get more information. Patient had an ablation on 1/19 and has been feeling discomfort in her chest. Patient woke up today feeling chest pressure greater than she had been feeling.She used her cardiomobile app which showed she was in A-fib. She took her medication and the cardiomobile app shows she is in sinus rhythm. At this time patient still feeling midsternal pressure in her chest. Discussed the issue with Dr. Geraldo Pitter and he recommended she go to the ER to have the chest pressure evaluated. Informed the patient of Dr. Julien Nordmann recommendation. Patient has no further questions at this time.

## 2021-12-20 NOTE — Telephone Encounter (Signed)
Pt c/o Shortness Of Breath: STAT if SOB developed within the last 24 hours or pt is noticeably SOB on the phone  1. Are you currently SOB (can you hear that pt is SOB on the phone)? no  2. How long have you been experiencing SOB? 15-20 min  3. Are you SOB when sitting or when up moving around? When patient woke up  4. Are you currently experiencing any other symptoms? No but patient did want to report that ekg on cardiomobile showed afib, took medication and after an hour showed normal sinus rhythm . No current symptoms and patient feels fine

## 2021-12-26 DIAGNOSIS — R002 Palpitations: Secondary | ICD-10-CM | POA: Diagnosis not present

## 2021-12-26 DIAGNOSIS — R0602 Shortness of breath: Secondary | ICD-10-CM | POA: Diagnosis not present

## 2021-12-26 NOTE — Telephone Encounter (Signed)
Pt has upcoming appt in clinic.

## 2021-12-27 ENCOUNTER — Telehealth: Payer: Self-pay | Admitting: Cardiology

## 2021-12-27 NOTE — Telephone Encounter (Signed)
Spoke to the patient just now and let her know Dr. Munley's recommendations. She verbalizes understanding.    Encouraged patient to call back with any questions or concerns.  

## 2021-12-27 NOTE — Telephone Encounter (Signed)
Patient is calling to report she was hospitalized at [facility] yesterday due to having sinus rhythm with supraventricular ectopy. They advised her that her magnesium was low and gave her that as well as an IV before sending her home around 10 PM yesterday. Reports they did not treat her heart rhythm. Please advise.

## 2021-12-28 DIAGNOSIS — E876 Hypokalemia: Secondary | ICD-10-CM | POA: Diagnosis not present

## 2021-12-28 DIAGNOSIS — R079 Chest pain, unspecified: Secondary | ICD-10-CM | POA: Diagnosis not present

## 2021-12-28 DIAGNOSIS — M545 Low back pain, unspecified: Secondary | ICD-10-CM | POA: Diagnosis not present

## 2021-12-28 DIAGNOSIS — M47816 Spondylosis without myelopathy or radiculopathy, lumbar region: Secondary | ICD-10-CM | POA: Diagnosis not present

## 2021-12-28 DIAGNOSIS — I700 Atherosclerosis of aorta: Secondary | ICD-10-CM | POA: Diagnosis not present

## 2021-12-28 DIAGNOSIS — Z6832 Body mass index (BMI) 32.0-32.9, adult: Secondary | ICD-10-CM | POA: Diagnosis not present

## 2021-12-28 DIAGNOSIS — M4316 Spondylolisthesis, lumbar region: Secondary | ICD-10-CM | POA: Diagnosis not present

## 2021-12-28 DIAGNOSIS — Z79899 Other long term (current) drug therapy: Secondary | ICD-10-CM | POA: Diagnosis not present

## 2021-12-28 DIAGNOSIS — M25561 Pain in right knee: Secondary | ICD-10-CM | POA: Diagnosis not present

## 2021-12-28 DIAGNOSIS — E1169 Type 2 diabetes mellitus with other specified complication: Secondary | ICD-10-CM | POA: Diagnosis not present

## 2021-12-28 DIAGNOSIS — I10 Essential (primary) hypertension: Secondary | ICD-10-CM | POA: Diagnosis not present

## 2022-01-02 DIAGNOSIS — R2241 Localized swelling, mass and lump, right lower limb: Secondary | ICD-10-CM | POA: Diagnosis not present

## 2022-01-02 DIAGNOSIS — R229 Localized swelling, mass and lump, unspecified: Secondary | ICD-10-CM | POA: Diagnosis not present

## 2022-01-09 DIAGNOSIS — M4316 Spondylolisthesis, lumbar region: Secondary | ICD-10-CM | POA: Diagnosis not present

## 2022-01-09 DIAGNOSIS — M48061 Spinal stenosis, lumbar region without neurogenic claudication: Secondary | ICD-10-CM | POA: Diagnosis not present

## 2022-01-09 DIAGNOSIS — M47816 Spondylosis without myelopathy or radiculopathy, lumbar region: Secondary | ICD-10-CM | POA: Diagnosis not present

## 2022-01-09 DIAGNOSIS — M5126 Other intervertebral disc displacement, lumbar region: Secondary | ICD-10-CM | POA: Diagnosis not present

## 2022-01-11 ENCOUNTER — Ambulatory Visit (HOSPITAL_COMMUNITY)
Admission: RE | Admit: 2022-01-11 | Discharge: 2022-01-11 | Disposition: A | Discharge status: Home / Self Care | Payer: HMO | Source: Ambulatory Visit | Attending: Cardiology | Admitting: Cardiology

## 2022-01-11 ENCOUNTER — Other Ambulatory Visit: Payer: Self-pay

## 2022-01-11 ENCOUNTER — Encounter (HOSPITAL_COMMUNITY): Payer: Self-pay | Admitting: Physician Assistant

## 2022-01-11 VITALS — BP 126/74 | HR 71 | Ht 60.0 in | Wt 168.2 lb

## 2022-01-11 DIAGNOSIS — M25552 Pain in left hip: Secondary | ICD-10-CM | POA: Diagnosis not present

## 2022-01-11 DIAGNOSIS — Z7901 Long term (current) use of anticoagulants: Secondary | ICD-10-CM | POA: Diagnosis not present

## 2022-01-11 DIAGNOSIS — I4892 Unspecified atrial flutter: Secondary | ICD-10-CM | POA: Insufficient documentation

## 2022-01-11 DIAGNOSIS — I10 Essential (primary) hypertension: Secondary | ICD-10-CM | POA: Diagnosis not present

## 2022-01-11 DIAGNOSIS — E669 Obesity, unspecified: Secondary | ICD-10-CM | POA: Insufficient documentation

## 2022-01-11 DIAGNOSIS — Z79899 Other long term (current) drug therapy: Secondary | ICD-10-CM | POA: Diagnosis not present

## 2022-01-11 DIAGNOSIS — E785 Hyperlipidemia, unspecified: Secondary | ICD-10-CM | POA: Insufficient documentation

## 2022-01-11 DIAGNOSIS — E118 Type 2 diabetes mellitus with unspecified complications: Secondary | ICD-10-CM | POA: Insufficient documentation

## 2022-01-11 DIAGNOSIS — I2510 Atherosclerotic heart disease of native coronary artery without angina pectoris: Secondary | ICD-10-CM | POA: Diagnosis not present

## 2022-01-11 DIAGNOSIS — R937 Abnormal findings on diagnostic imaging of other parts of musculoskeletal system: Secondary | ICD-10-CM | POA: Diagnosis not present

## 2022-01-11 DIAGNOSIS — D6869 Other thrombophilia: Secondary | ICD-10-CM | POA: Diagnosis not present

## 2022-01-11 DIAGNOSIS — M48062 Spinal stenosis, lumbar region with neurogenic claudication: Secondary | ICD-10-CM | POA: Diagnosis not present

## 2022-01-11 DIAGNOSIS — I480 Paroxysmal atrial fibrillation: Secondary | ICD-10-CM | POA: Diagnosis not present

## 2022-01-11 DIAGNOSIS — Z6832 Body mass index (BMI) 32.0-32.9, adult: Secondary | ICD-10-CM | POA: Insufficient documentation

## 2022-01-11 DIAGNOSIS — I700 Atherosclerosis of aorta: Secondary | ICD-10-CM | POA: Insufficient documentation

## 2022-01-11 DIAGNOSIS — M47819 Spondylosis without myelopathy or radiculopathy, site unspecified: Secondary | ICD-10-CM | POA: Diagnosis not present

## 2022-01-11 DIAGNOSIS — R102 Pelvic and perineal pain: Secondary | ICD-10-CM | POA: Diagnosis not present

## 2022-01-11 DIAGNOSIS — Z6838 Body mass index (BMI) 38.0-38.9, adult: Secondary | ICD-10-CM | POA: Diagnosis not present

## 2022-01-11 DIAGNOSIS — M25559 Pain in unspecified hip: Secondary | ICD-10-CM | POA: Diagnosis not present

## 2022-01-11 MED ORDER — FUROSEMIDE 40 MG PO TABS: 40.0000 mg | ORAL_TABLET | Freq: Every day | ORAL | 1 refills | Status: AC | PRN

## 2022-01-11 MED ORDER — CARVEDILOL 6.25 MG PO TABS: 6.2500 mg | ORAL_TABLET | Freq: Two times a day (BID) | ORAL | 1 refills | Status: AC | PRN

## 2022-01-11 NOTE — Progress Notes (Signed)
Primary Care Physician: Ernestene Kiel, MD Primary Cardiologist: Dr Bettina Gavia Primary Electrophysiologist: Dr Quentin Ore Referring Physician: Dr Shawnie Pons is a 71 y.o. female with a history of HTN, DM, aortic atherosclerosis, CAD, HLD, atrial flutter, atrial fibrillation who presents for follow up in the [facility]. Patient is on Eliquis for a CHADS2VASC score of 5. She underwent afib and flutter ablation with Dr Quentin Ore on 12/14/21. Patient reports that she had two episodes of palpitations since the ablation. One episode lasted 20-30 minutes and the other lasted several hours. Review of her Jodelle Red today showed SR with frequent PACs. She is in SR today. No CP, swallowing pain, or groin issues.   Today, she denies symptoms of chest pain, shortness of breath, orthopnea, PND, lower extremity edema, dizziness, presyncope, syncope, snoring, daytime somnolence, bleeding, or neurologic sequela. The patient is tolerating medications without difficulties and is otherwise without complaint today.    Atrial Fibrillation Risk Factors:  she does not have symptoms or diagnosis of sleep apnea. she does not have a history of rheumatic fever.   she has a BMI of Body mass index is 32.85 kg/m.Marland Kitchen Filed Weights   01/11/22 1122  Weight: 76.3 kg    Family History  Problem Relation Age of Onset   Hypertension Mother    Hyperthyroidism Mother    AAA (abdominal aortic aneurysm) Mother    Diabetes Father    Hypertension Father    Heart attack Father    CAD Father    Stroke Father      Atrial Fibrillation Management history:  Previous antiarrhythmic drugs: flecainide  Previous cardioversions: none Previous ablations: 12/14/21 CHADS2VASC score: 5 Anticoagulation history: Eliquis   Past Medical History:  Diagnosis Date   Acute hypoxemic respiratory failure ([place]) 06/24/2018   Aortic atherosclerosis ([place]) 02/26/2017   Bradycardia 04/18/2018   Cardiogenic  shock ([place]) 19/14/7829   Complication of anesthesia    "caine" intolerance   Essential hypertension 04/18/2018   GERD (gastroesophageal reflux disease) 04/07/2018   HLD (hyperlipidemia) 04/07/2018   Hypercalcemia 56/21/3086   Metabolic acidosis with respiratory acidosis 06/24/2018   Osteoarthritis of spine 02/21/2017   PSVT (paroxysmal supraventricular tachycardia) ([place]) 06/03/2018   Shock liver 06/24/2018   Type 2 diabetes mellitus without complication ([place]) 57/84/6962   Unspecified asthma, uncomplicated 95/28/4132   Vitamin D deficiency 05/09/2016   Past Surgical History:  Procedure Laterality Date   ABDOMINAL HYSTERECTOMY     ATRIAL FIBRILLATION ABLATION N/A 12/14/2021   Procedure: ATRIAL FIBRILLATION ABLATION;  Surgeon: Vickie Epley, MD;  Location: [facility];  Service: Cardiovascular;  Laterality: N/A;   CARPOMETACARPEL SUSPENSION PLASTY Right 07/06/2021   Procedure: RIGHT THUMB TRAPEZIECTOMY  SUSPENSION PLASTY;  Surgeon: Leanora Cover, MD;  Location: [facility] [facility];  Service: Orthopedics;  Laterality: Right;   INSERT / REPLACE / REMOVE PACEMAKER     KNEE SURGERY Left    TRIGGER FINGER RELEASE Right 07/06/2021   Procedure: RELEASE TRIGGER FINGER/A-1 PULLEY RIGHT LONG TRIGGER;  Surgeon: Leanora Cover, MD;  Location: [facility];  Service: Orthopedics;  Laterality: Right;    Current Outpatient Medications  Medication Sig Dispense Refill   acyclovir (ZOVIRAX) 400 MG tablet Take 400 mg by mouth daily.     albuterol (PROVENTIL) (2.5 MG/3ML) 0.083% nebulizer solution Take 2.5 mg by nebulization every 6 (six) hours as needed for wheezing or shortness of breath.     albuterol (VENTOLIN HFA) 108 (90 Base) MCG/ACT inhaler Inhale 2  puffs into the lungs every 6 (six) hours as needed for wheezing or shortness of breath.     ALPRAZolam (XANAX) 0.25 MG tablet Take 0.25 mg by mouth 2 (two) times daily as needed for anxiety.     baclofen (LIORESAL) 10 MG tablet  Take 10 mg by mouth 3 (three) times daily as needed for muscle spasms.     carvedilol (COREG) 12.5 MG tablet Take 12.5 mg by mouth 2 (two) times daily with a meal.     carvedilol (COREG) 6.25 MG tablet Take 6.25 mg by mouth 2 (two) times daily as needed (palpitations).     chlorthalidone (HYGROTON) 50 MG tablet Take 25 mg by mouth daily.     Cholecalciferol (D3) 50 MCG (2000 UT) TABS Take 2,000 Units by mouth in the morning and at bedtime.     diclofenac Sodium (VOLTAREN) 1 % GEL Apply 1 application topically 2 (two) times daily as needed (pain).     ELIQUIS 5 MG TABS tablet Take 1 tablet (5 mg total) by mouth 2 (two) times daily. 180 tablet 3   flecainide (TAMBOCOR) 50 MG tablet Take 1 tablet (50 mg total) by mouth 2 (two) times daily. 180 tablet 3   FREESTYLE LITE test strip 1 each 2 (two) times daily.     furosemide (LASIX) 40 MG tablet Take 40 mg by mouth daily as needed for fluid or edema.     glipiZIDE (GLUCOTROL XL) 10 MG 24 hr tablet Take 10 mg by mouth 2 (two) times daily.     hydrALAZINE (APRESOLINE) 50 MG tablet Take 50 mg by mouth 2 (two) times daily.     Magnesium Cl-Calcium Carbonate (SLOW-MAG PO) Take 1 tablet by mouth in the morning and at bedtime.     montelukast (SINGULAIR) 10 MG tablet Take 10 mg by mouth at bedtime.     nitroGLYCERIN (NITROSTAT) 0.4 MG SL tablet Place 0.4 mg under the tongue every 5 (five) minutes as needed for chest pain.     nystatin cream (MYCOSTATIN) Apply 1 application topically 2 (two) times daily as needed for dry skin.     omeprazole (PRILOSEC) 40 MG capsule Take 40 mg by mouth daily.     oxymetazoline (AFRIN) 0.05 % nasal spray Place 1 spray into both nostrils 2 (two) times daily as needed for congestion.     pantoprazole (PROTONIX) 40 MG tablet Take 1 tablet (40 mg total) by mouth daily. 45 tablet 0   pioglitazone (ACTOS) 30 MG tablet Take 30 mg by mouth daily.     potassium chloride SA (KLOR-CON) 20 MEQ tablet Take 20 mEq by mouth 3 (three) times  daily.     rosuvastatin (CRESTOR) 5 MG tablet Take 5 mg by mouth every Monday.     Semaglutide,0.25 or 0.5MG /DOS, (OZEMPIC, 0.25 OR 0.5 MG/DOSE,) 2 MG/1.5ML SOPN Inject 0.5 mg into the skin every Monday.     temazepam (RESTORIL) 30 MG capsule Take 30 mg by mouth at bedtime.     tiZANidine (ZANAFLEX) 4 MG tablet Take 4 mg by mouth every 8 (eight) hours as needed for muscle spasms.     traMADol (ULTRAM) 50 MG tablet Take 50 mg by mouth 3 (three) times daily as needed for severe pain.     colchicine 0.6 MG tablet Take 1 tablet (0.6 mg total) by mouth 2 (two) times daily for 5 days. 10 tablet 0   No current facility-administered medications for this encounter.    Allergies  Allergen Reactions  Angiotensin Receptor Blockers Cough    Losartan    Codeine Itching   Latex Other (See Comments)    Blisters   Meperidine Other (See Comments)    HA    Zithromax [Azithromycin]     May or may not be a contraindication - heart flutter?   Exforge [Amlodipine Besylate-Valsartan] Palpitations   Sumatriptan Palpitations    Social History   Socioeconomic History   Marital status: Single    Spouse name: Not on file   Number of children: Not on file   Years of education: Not on file   Highest education level: Not on file  Occupational History   Not on file  Tobacco Use   Smoking status: Former    Types: Cigarettes    Quit date: 11/27/1983    Years since quitting: 38.1   Smokeless tobacco: Never   Tobacco comments:    Former smoker 01/11/22  Vaping Use   Vaping Use: Never used  Substance and Sexual Activity   Alcohol use: Never   Drug use: Not Currently   Sexual activity: Not on file  Other Topics Concern   Not on file  Social History Narrative   Not on file   Social Determinants of Health   Financial Resource Strain: Not on file  Food Insecurity: Not on file  Transportation Needs: Not on file  Physical Activity: Not on file  Stress: Not on file  Social Connections: Not on file   Intimate Partner Violence: Not on file     ROS- All systems are reviewed and negative except as per the HPI above.  Physical Exam: Vitals:   01/11/22 1122  BP: 126/74  Pulse: 71  Weight: 76.3 kg  Height: 5' (1.524 m)    GEN- The patient is a well appearing obese female, alert and oriented x 3 today.   Head- normocephalic, atraumatic Eyes-  Sclera clear, conjunctiva pink Ears- hearing intact Oropharynx- clear Neck- supple  Lungs- Clear to ausculation bilaterally, normal work of breathing Heart- Regular rate and rhythm, no murmurs, rubs or gallops  GI- soft, NT, ND, + BS Extremities- no clubbing, cyanosis, or edema MS- no significant deformity or atrophy Skin- no rash or lesion Psych- euthymic mood, full affect Neuro- strength and sensation are intact  Wt Readings from Last 3 Encounters:  01/11/22 76.3 kg  12/14/21 74.8 kg  10/25/21 77.4 kg    EKG today demonstrates  SR, 1st degree AV block Vent. rate 71 BPM PR interval 232 ms QRS duration 84 ms QT/QTcB 390/423 ms   Epic records are reviewed at length today  CHA2DS2-VASc Score = 5  The patient's score is based upon: CHF History: 0 HTN History: 1 Diabetes History: 1 Stroke History: 0 Vascular Disease History: 1 Age Score: 1 Gender Score: 1       ASSESSMENT AND PLAN: 1. Paroxysmal Atrial Fibrillation/atrial flutter The patient's CHA2DS2-VASc score is 5, indicating a 7.2% annual risk of stroke.   S/p afib and flutter ablation 12/14/21 Reassured patient that some afib/atrial ectopy is not unusual following an ablation.  Continue carvedilol 12.5 mg BID with 6.25 mg BID PRN for heart racing Continue flecainide 50 mg BID Continue Eliquis 5 mg BID [place] for home monitoring.   2. Secondary Hypercoagulable State (ICD10:  D68.69) The patient is at significant risk for stroke/thromboembolism based upon her CHA2DS2-VASc Score of 5.  Continue Apixaban (Eliquis).   3. Obesity Body mass index is 32.85  kg/m. Lifestyle modification was discussed at length including regular  exercise and weight reduction.  4. HTN Stable, no changes today.  5. CAD/aortic atherosclerosis Noted on CT No anginal symptoms On statin   Follow up with Dr Quentin Ore and Dr Bettina Gavia as scheduled.    [facility] [address] [place], [place] [address] [phone] 01/11/2022 11:33 AM

## 2022-01-16 DIAGNOSIS — M5125 Other intervertebral disc displacement, thoracolumbar region: Secondary | ICD-10-CM | POA: Diagnosis not present

## 2022-01-16 DIAGNOSIS — M4804 Spinal stenosis, thoracic region: Secondary | ICD-10-CM | POA: Diagnosis not present

## 2022-01-16 DIAGNOSIS — M5124 Other intervertebral disc displacement, thoracic region: Secondary | ICD-10-CM | POA: Diagnosis not present

## 2022-01-16 DIAGNOSIS — M47814 Spondylosis without myelopathy or radiculopathy, thoracic region: Secondary | ICD-10-CM | POA: Diagnosis not present

## 2022-01-17 ENCOUNTER — Ambulatory Visit: Payer: HMO | Admitting: Cardiology

## 2022-01-22 DIAGNOSIS — K219 Gastro-esophageal reflux disease without esophagitis: Secondary | ICD-10-CM | POA: Diagnosis not present

## 2022-01-22 DIAGNOSIS — Z1331 Encounter for screening for depression: Secondary | ICD-10-CM | POA: Diagnosis not present

## 2022-01-22 DIAGNOSIS — I480 Paroxysmal atrial fibrillation: Secondary | ICD-10-CM | POA: Diagnosis not present

## 2022-01-22 DIAGNOSIS — Z6832 Body mass index (BMI) 32.0-32.9, adult: Secondary | ICD-10-CM | POA: Diagnosis not present

## 2022-01-22 DIAGNOSIS — G9581 Conus medullaris syndrome: Secondary | ICD-10-CM | POA: Diagnosis not present

## 2022-01-23 DIAGNOSIS — M48062 Spinal stenosis, lumbar region with neurogenic claudication: Secondary | ICD-10-CM | POA: Diagnosis not present

## 2022-01-23 DIAGNOSIS — M4316 Spondylolisthesis, lumbar region: Secondary | ICD-10-CM | POA: Diagnosis not present

## 2022-01-23 DIAGNOSIS — M5440 Lumbago with sciatica, unspecified side: Secondary | ICD-10-CM | POA: Diagnosis not present

## 2022-01-31 DIAGNOSIS — R293 Abnormal posture: Secondary | ICD-10-CM | POA: Diagnosis not present

## 2022-01-31 DIAGNOSIS — M79605 Pain in left leg: Secondary | ICD-10-CM | POA: Diagnosis not present

## 2022-01-31 DIAGNOSIS — M6281 Muscle weakness (generalized): Secondary | ICD-10-CM | POA: Diagnosis not present

## 2022-01-31 DIAGNOSIS — M4316 Spondylolisthesis, lumbar region: Secondary | ICD-10-CM | POA: Diagnosis not present

## 2022-01-31 DIAGNOSIS — M2560 Stiffness of unspecified joint, not elsewhere classified: Secondary | ICD-10-CM | POA: Diagnosis not present

## 2022-02-01 DIAGNOSIS — M4802 Spinal stenosis, cervical region: Secondary | ICD-10-CM | POA: Diagnosis not present

## 2022-02-01 DIAGNOSIS — G950 Syringomyelia and syringobulbia: Secondary | ICD-10-CM | POA: Diagnosis not present

## 2022-02-01 DIAGNOSIS — M5021 Other cervical disc displacement,  high cervical region: Secondary | ICD-10-CM | POA: Diagnosis not present

## 2022-02-22 DIAGNOSIS — M4316 Spondylolisthesis, lumbar region: Secondary | ICD-10-CM | POA: Diagnosis not present

## 2022-02-22 DIAGNOSIS — M5440 Lumbago with sciatica, unspecified side: Secondary | ICD-10-CM | POA: Diagnosis not present

## 2022-03-01 DIAGNOSIS — I480 Paroxysmal atrial fibrillation: Secondary | ICD-10-CM | POA: Diagnosis not present

## 2022-03-01 DIAGNOSIS — E785 Hyperlipidemia, unspecified: Secondary | ICD-10-CM | POA: Diagnosis not present

## 2022-03-01 DIAGNOSIS — M4316 Spondylolisthesis, lumbar region: Secondary | ICD-10-CM | POA: Diagnosis not present

## 2022-03-01 DIAGNOSIS — J309 Allergic rhinitis, unspecified: Secondary | ICD-10-CM | POA: Diagnosis not present

## 2022-03-01 DIAGNOSIS — G9581 Conus medullaris syndrome: Secondary | ICD-10-CM | POA: Diagnosis not present

## 2022-03-01 DIAGNOSIS — R293 Abnormal posture: Secondary | ICD-10-CM | POA: Diagnosis not present

## 2022-03-01 DIAGNOSIS — K219 Gastro-esophageal reflux disease without esophagitis: Secondary | ICD-10-CM | POA: Diagnosis not present

## 2022-03-01 DIAGNOSIS — Z1331 Encounter for screening for depression: Secondary | ICD-10-CM | POA: Diagnosis not present

## 2022-03-01 DIAGNOSIS — M545 Low back pain, unspecified: Secondary | ICD-10-CM | POA: Diagnosis not present

## 2022-03-01 DIAGNOSIS — M79605 Pain in left leg: Secondary | ICD-10-CM | POA: Diagnosis not present

## 2022-03-01 DIAGNOSIS — E782 Mixed hyperlipidemia: Secondary | ICD-10-CM | POA: Diagnosis not present

## 2022-03-01 DIAGNOSIS — M2560 Stiffness of unspecified joint, not elsewhere classified: Secondary | ICD-10-CM | POA: Diagnosis not present

## 2022-03-01 DIAGNOSIS — Z0000 Encounter for general adult medical examination without abnormal findings: Secondary | ICD-10-CM | POA: Diagnosis not present

## 2022-03-01 DIAGNOSIS — Z780 Asymptomatic menopausal state: Secondary | ICD-10-CM | POA: Diagnosis not present

## 2022-03-01 DIAGNOSIS — M6281 Muscle weakness (generalized): Secondary | ICD-10-CM | POA: Diagnosis not present

## 2022-03-01 DIAGNOSIS — Z13820 Encounter for screening for osteoporosis: Secondary | ICD-10-CM | POA: Diagnosis not present

## 2022-03-01 DIAGNOSIS — E1169 Type 2 diabetes mellitus with other specified complication: Secondary | ICD-10-CM | POA: Diagnosis not present

## 2022-03-08 DIAGNOSIS — Z01818 Encounter for other preprocedural examination: Secondary | ICD-10-CM | POA: Diagnosis not present

## 2022-03-08 DIAGNOSIS — H2512 Age-related nuclear cataract, left eye: Secondary | ICD-10-CM | POA: Diagnosis not present

## 2022-03-08 DIAGNOSIS — H2511 Age-related nuclear cataract, right eye: Secondary | ICD-10-CM | POA: Diagnosis not present

## 2022-03-09 ENCOUNTER — Telehealth: Payer: Self-pay | Admitting: *Deleted

## 2022-03-09 NOTE — Telephone Encounter (Signed)
? ?  Patient Name: Cathy Walker  ?DOB: 1951/01/04 ?MRN: [id number] ? ?Primary Cardiologist: Shirlee More, MD ? ?Chart reviewed as part of pre-operative protocol coverage. Cataract extractions are recognized in guidelines as low risk surgeries that do not typically require specific preoperative testing or holding of blood thinner therapy. Therefore, given past medical history and time since last visit, based on ACC/AHA guidelines, Anwar Crill would be at acceptable risk for the planned procedure without further cardiovascular testing.  ? ?I will route this recommendation to the requesting party via Epic fax function and remove from pre-op pool. ? ?Please call with questions. ? ?Ledora Bottcher, PA ?03/09/2022, 10:49 AM ? ?

## 2022-03-09 NOTE — Telephone Encounter (Signed)
? ?  Pre-operative Risk Assessment  ?  ?Patient Name: Cathy Walker  ?DOB: [birth year] ?MRN: [id number]  ? ?  ? ?Request for Surgical Clearance   ? ?Procedure:   CATARACT EXTRACTION BY  PE, IOL-RIGHT THEN LEFT ? ?Date of Surgery:  Clearance 03/16/22                              ?   ?Surgeon:  DR. Julian Reil ?Surgeon's Group or Practice Name:  [facility]  ?Phone number:  [phone] ?Fax number:  [phone] ?  ?Type of Clearance Requested:   ?- Medical PER DR. MINCEY THE PT WILL NOT NEED TO HOLD ANY MEDICATIONS ?  ?Type of Anesthesia:   IV SEDATION ?  ?Additional requests/questions:   ? ?Signed, ?Julaine Hua   ?03/09/2022, 8:50 AM  ? ?

## 2022-03-16 ENCOUNTER — Ambulatory Visit: Payer: HMO | Admitting: Cardiology

## 2022-03-16 DIAGNOSIS — H2511 Age-related nuclear cataract, right eye: Secondary | ICD-10-CM | POA: Diagnosis not present

## 2022-03-20 ENCOUNTER — Telehealth: Payer: Self-pay | Admitting: Cardiology

## 2022-03-20 ENCOUNTER — Telehealth: Payer: Self-pay | Admitting: *Deleted

## 2022-03-20 ENCOUNTER — Ambulatory Visit (INDEPENDENT_AMBULATORY_CARE_PROVIDER_SITE_OTHER): Payer: HMO | Admitting: Physician Assistant

## 2022-03-20 DIAGNOSIS — I480 Paroxysmal atrial fibrillation: Secondary | ICD-10-CM

## 2022-03-20 DIAGNOSIS — Z01810 Encounter for preprocedural cardiovascular examination: Secondary | ICD-10-CM | POA: Diagnosis not present

## 2022-03-20 NOTE — Telephone Encounter (Signed)
Pt agreeable to tele pre op appt today.  ? ?  ?Patient Consent for Virtual Visit  ? ? ?   ? ?[name] has provided verbal consent on 03/20/2022 for a virtual visit (video or telephone). ? ? ?CONSENT FOR VIRTUAL VISIT FOR:  Cathy Walker  ?By participating in this virtual visit I agree to the following: ? ?I hereby voluntarily request, consent and authorize [facility] and its employed or contracted physicians, physician assistants, nurse practitioners or other licensed health care professionals (the Practitioner), to provide me with telemedicine health care services (the ?Services") as deemed necessary by the treating Practitioner. I acknowledge and consent to receive the Services by the Practitioner via telemedicine. I understand that the telemedicine visit will involve communicating with the Practitioner through live audiovisual communication technology and the disclosure of certain medical information by electronic transmission. I acknowledge that I have been given the opportunity to request an in-person assessment or other available alternative prior to the telemedicine visit and am voluntarily participating in the telemedicine visit. ? ?I understand that I have the right to withhold or withdraw my consent to the use of telemedicine in the course of my care at any time, without affecting my right to future care or treatment, and that the Practitioner or I may terminate the telemedicine visit at any time. I understand that I have the right to inspect all information obtained and/or recorded in the course of the telemedicine visit and may receive copies of available information for a reasonable fee.  I understand that some of the potential risks of receiving the Services via telemedicine include:  ?Delay or interruption in medical evaluation due to technological equipment failure or disruption; ?Information transmitted may not be sufficient (e.g. poor resolution of images) to allow for appropriate medical  decision making by the Practitioner; and/or  ?In rare instances, security protocols could fail, causing a breach of personal health information. ? ?Furthermore, I acknowledge that it is my responsibility to provide information about my medical history, conditions and care that is complete and accurate to the best of my ability. I acknowledge that Practitioner's advice, recommendations, and/or decision may be based on factors not within their control, such as incomplete or inaccurate data provided by me or distortions of diagnostic images or specimens that may result from electronic transmissions. I understand that the practice of medicine is not an exact science and that Practitioner makes no warranties or guarantees regarding treatment outcomes. I acknowledge that a copy of this consent can be made available to me via my patient portal (Export), or I can request a printed copy by calling the office of [facility].   ? ?I understand that my insurance will be billed for this visit.  ? ?I have read or had this consent read to me. ?I understand the contents of this consent, which adequately explains the benefits and risks of the Services being provided via telemedicine.  ?I have been provided ample opportunity to ask questions regarding this consent and the Services and have had my questions answered to my satisfaction. ?I give my informed consent for the services to be provided through the use of telemedicine in my medical care ? ? ? ?

## 2022-03-20 NOTE — Telephone Encounter (Signed)
New Message: ? ? ? ? ? ?Patient says he is scheduled for injection in her lower spine on Thursday(03-22-22). She is very concerned, because she is on Eliquis and have been having some Afib lately. She said she found out yesterday that  being on Eliquis and having an injection, could cost a blood clot. She wants to know what does Dr Bettina Gavia recommend? She needs to know this asap, in case she needs to cancel the injection. ?

## 2022-03-20 NOTE — Telephone Encounter (Signed)
? ?  Pre-operative Risk Assessment  ?  ?Patient Name: Cathy Walker  ?DOB: 1951-09-05 ?MRN: [id number]  ? ?  ? ?Request for Surgical Clearance   ? ?Procedure:  Spinal Injection  ? ?Date of Surgery:  Clearance 03/22/22                              ?   ?Surgeon:  Dr. Davy Pique ?Surgeon's Group or Practice Name:  [facility] ?Phone number:  [phone] ?Fax number:   ?  ?Type of Clearance Requested:  Pharmacy Hold Eliquis  ? ?  ?Type of Anesthesia:  Not Indicated ?  ?Additional requests/questions:  Patient concerned about Eliquis  ? ?Signed, ?Johnna Acosta   ?03/20/2022, 8:13 AM   ?

## 2022-03-20 NOTE — Telephone Encounter (Signed)
Preoperative team, please contact this patient and set up a phone call appointment for further preoperative risk assessment. Please obtain consent and complete medication review. Thank you for your help.  ? ?Seems patient have afib.  ?

## 2022-03-20 NOTE — Progress Notes (Addendum)
? ?Virtual Visit via Telephone Note  ? ?This visit type was conducted due to national recommendations for restrictions regarding the COVID-19 Pandemic (e.g. social distancing) in an effort to limit this patient's exposure and mitigate transmission in our community.  Due to her co-morbid illnesses, this patient is at least at moderate risk for complications without adequate follow up.  This format is felt to be most appropriate for this patient at this time.  The patient did not have access to video technology/had technical difficulties with video requiring transitioning to audio format only (telephone).  All issues noted in this document were discussed and addressed.  No physical exam could be performed with this format.  Please refer to the patient's chart for her  consent to telehealth for [facility]. ? ?Evaluation Performed:  Preoperative cardiovascular risk assessment ?_____________  ? ?Date:  03/20/2022  ? ?Patient ID:  Cathy Walker, DOB 1950-12-31, MRN [id number] ?Patient Location:  ?Home ?Provider location:   ?Office ? ?Primary Care Provider:  Ernestene Kiel, MD ?Primary Cardiologist:  Shirlee More, MD ? ?Chief Complaint  ?  ?71 y.o. y/o female with a h/o  HTN, DM, aortic atherosclerosis, CAD, HLD, atrial flutter, atrial fibrillation, who is pending Spinal injection, and presents today for telephonic preoperative cardiovascular risk assessment. ? ?Past Medical History  ?  ?Past Medical History:  ?Diagnosis Date  ? Acute hypoxemic respiratory failure ([place]) 06/24/2018  ? Aortic atherosclerosis ([place]) 02/26/2017  ? Bradycardia 04/18/2018  ? Cardiogenic shock ([place]) 06/24/2018  ? Complication of anesthesia   ? "caine" intolerance  ? Essential hypertension 04/18/2018  ? GERD (gastroesophageal reflux disease) 04/07/2018  ? HLD (hyperlipidemia) 04/07/2018  ? Hypercalcemia 09/25/2016  ? Metabolic acidosis with respiratory acidosis 06/24/2018  ? Osteoarthritis of spine 02/21/2017  ? PSVT (paroxysmal  supraventricular tachycardia) ([place]) 06/03/2018  ? Shock liver 06/24/2018  ? Type 2 diabetes mellitus without complication ([place]) 40/98/1191  ? Unspecified asthma, uncomplicated 47/82/9562  ? Vitamin D deficiency 05/09/2016  ? ?Past Surgical History:  ?Procedure Laterality Date  ? ABDOMINAL HYSTERECTOMY    ? ATRIAL FIBRILLATION ABLATION N/A 12/14/2021  ? Procedure: ATRIAL FIBRILLATION ABLATION;  Surgeon: Vickie Epley, MD;  Location: [facility];  Service: Cardiovascular;  Laterality: N/A;  ? CARPOMETACARPEL SUSPENSION PLASTY Right 07/06/2021  ? Procedure: RIGHT THUMB TRAPEZIECTOMY  SUSPENSION PLASTY;  Surgeon: Leanora Cover, MD;  Location: [facility];  Service: Orthopedics;  Laterality: Right;  ? INSERT / REPLACE / REMOVE PACEMAKER    ? KNEE SURGERY Left   ? TRIGGER FINGER RELEASE Right 07/06/2021  ? Procedure: RELEASE TRIGGER FINGER/A-1 PULLEY RIGHT LONG TRIGGER;  Surgeon: Leanora Cover, MD;  Location: [facility];  Service: Orthopedics;  Laterality: Right;  ? ? ?Allergies ? ?Allergies  ?Allergen Reactions  ? Angiotensin Receptor Blockers Cough  ?  Losartan   ? Codeine Itching  ? Latex Other (See Comments)  ?  Blisters  ? Meperidine Other (See Comments)  ?  HA ?  ? Zithromax [Azithromycin]   ?  May or may not be a contraindication - heart flutter?  ? Exforge [Amlodipine Besylate-Valsartan] Palpitations  ? Sumatriptan Palpitations  ? ? ?History of Present Illness  ?  ?Cathy Walker is a 71 y.o. female who presents via audio/video conferencing for a telehealth visit today.  Pt was last seen in cardiology clinic on 01/11/22 by Malka So, OA in afib clinic.  At that time Devyne Hauger was doing well.  The patient is now pending spinal injection. .   ? ?  She underwent afib and flutter ablation with Dr Quentin Ore on 12/14/21.  Spoke with patient.  Patient reports intermittent atrial fibrillation.  She checks her rhythm on a Kardia device as well as she is symptomatic.  Patient reports  intermittent palpitation with shortness of breath, fatigue and tiredness.  Feels like rainout.  Each episode lasting for 63mnutes to 60 minutes.  Occasionally is last for greater than 3 hours.  Last episode occurred on April 13 which lasted for about 3 to 4 hours. ? ?Otherwise, patient denies orthopnea, PND, syncope, lower extremity edema or melena.  She is not holding her Eliquis. ? ?Home Medications  ?  ?Prior to Admission medications   ?Medication Sig Start Date End Date Taking? Authorizing Provider  ?acyclovir (ZOVIRAX) 400 MG tablet Take 400 mg by mouth daily.    [provider]  ?albuterol (PROVENTIL) (2.5 MG/3ML) 0.083% nebulizer solution Take 2.5 mg by nebulization every 6 (six) hours as needed for wheezing or shortness of breath.    [provider]  ?albuterol (VENTOLIN HFA) 108 (90 Base) MCG/ACT inhaler Inhale 2 puffs into the lungs every 6 (six) hours as needed for wheezing or shortness of breath.    [provider]  ?ALPRAZolam (Duanne Moron 0.25 MG tablet Take 0.25 mg by mouth 2 (two) times daily as needed for anxiety. 04/05/21   [provider]  ?baclofen (LIORESAL) 10 MG tablet Take 10 mg by mouth 3 (three) times daily as needed for muscle spasms.    [provider]  ?carvedilol (COREG) 12.5 MG tablet Take 12.5 mg by mouth 2 (two) times daily with a meal.    [provider]  ?carvedilol (COREG) 6.25 MG tablet Take 1 tablet (6.25 mg total) by mouth 2 (two) times daily as needed (palpitations). 01/11/22   Fenton, Clint R, PA  ?chlorthalidone (HYGROTON) 50 MG tablet Take 25 mg by mouth daily.    [provider]  ?Cholecalciferol (D3) 50 MCG (2000 UT) TABS Take 2,000 Units by mouth in the morning and at bedtime.    [provider]  ?colchicine 0.6 MG tablet Take 1 tablet (0.6 mg total) by mouth 2 (two) times daily for 5 days. 12/14/21 12/19/21  UBaldwin Jamaica PA-C  ?diclofenac Sodium (VOLTAREN) 1 % GEL Apply 1 application topically 2 (two)  times daily as needed (pain).    [provider]  ?ELIQUIS 5 MG TABS tablet Take 1 tablet (5 mg total) by mouth 2 (two) times daily. 10/25/21   MRichardo Priest MD  ?flecainide (TAMBOCOR) 50 MG tablet Take 1 tablet (50 mg total) by mouth 2 (two) times daily. 10/25/21   MRichardo Priest MD  ?FREESTYLE LITE test strip 1 each 2 (two) times daily. 10/16/21   [provider]  ?furosemide (LASIX) 40 MG tablet Take 1 tablet (40 mg total) by mouth daily as needed for fluid or edema. 01/11/22   MRichardo Priest MD  ?glipiZIDE (GLUCOTROL XL) 10 MG 24 hr tablet Take 10 mg by mouth 2 (two) times daily.    [provider]  ?hydrALAZINE (APRESOLINE) 50 MG tablet Take 50 mg by mouth 2 (two) times daily.    [provider]  ?Magnesium Cl-Calcium Carbonate (SLOW-MAG PO) Take 1 tablet by mouth in the morning and at bedtime.    [provider]  ?montelukast (SINGULAIR) 10 MG tablet Take 10 mg by mouth at bedtime.    [provider]  ?nitroGLYCERIN (NITROSTAT) 0.4 MG SL tablet Place 0.4 mg under the  tongue every 5 (five) minutes as needed for chest pain.    [provider]  ?nystatin cream (MYCOSTATIN) Apply 1 application topically 2 (two) times daily as needed for dry skin.    [provider]  ?omeprazole (PRILOSEC) 40 MG capsule Take 40 mg by mouth daily.    [provider]  ?oxymetazoline (AFRIN) 0.05 % nasal spray Place 1 spray into both nostrils 2 (two) times daily as needed for congestion.    [provider]  ?pantoprazole (PROTONIX) 40 MG tablet Take 1 tablet (40 mg total) by mouth daily. 12/14/21 01/28/22  Baldwin Jamaica, PA-C  ?pioglitazone (ACTOS) 30 MG tablet Take 30 mg by mouth daily.    [provider]  ?potassium chloride SA (KLOR-CON) 20 MEQ tablet Take 20 mEq by mouth 3 (three) times daily.    [provider]  ?rosuvastatin (CRESTOR) 5 MG tablet Take 5 mg by mouth every Monday.    [provider]   ?Semaglutide,0.25 or 0.'5MG'$ /DOS, (OZEMPIC, 0.25 OR 0.5 MG/DOSE,) 2 MG/1.5ML SOPN Inject 0.5 mg into the skin every Monday.    [provider]  ?temazepam (RESTORIL) 30 MG capsule Take 30 mg by mouth at bedtime. 5/2/

## 2022-03-20 NOTE — Telephone Encounter (Signed)
Patient with diagnosis of A Fib on Eliquis for anticoagulation.   ? ?Procedure: spinal injection ?Date of procedure: 03/22/22 ? ?CHA2DS2-VASc Score = 5  ?This indicates a 7.2% annual risk of stroke. ?The patient's score is based upon: ?CHF History: 0 ?HTN History: 1 ?Diabetes History: 1 ?Stroke History: 0 ?Vascular Disease History: 1 ?Age Score: 1 ?Gender Score: 1 ?  ?CrCl 75 mL/min ?Platelet count 293K ? ?Per office protocol, patient should hold Eliquis for 3 days prior to procedure.  Injection will need to be rescheduled. ?

## 2022-03-20 NOTE — Telephone Encounter (Signed)
Will address in separate clearance encounter. ?

## 2022-03-21 ENCOUNTER — Telehealth: Payer: Self-pay | Admitting: *Deleted

## 2022-03-21 NOTE — Telephone Encounter (Signed)
FAX # [phone] ?

## 2022-03-21 NOTE — Progress Notes (Signed)
I have left a detailed message for the pt that she has been cleared and ok to hold Eliquis x 3 days prior to procedure. Clearance notes will be faxed to requesting office today.  ?

## 2022-03-21 NOTE — Telephone Encounter (Signed)
I have left a detailed message for the pt that she has been cleared and ok to hold Eliquis x 3 days prior to procedure. Clearance notes will be faxed to requesting office today.  ?

## 2022-03-26 DIAGNOSIS — M6281 Muscle weakness (generalized): Secondary | ICD-10-CM | POA: Diagnosis not present

## 2022-03-26 DIAGNOSIS — M79605 Pain in left leg: Secondary | ICD-10-CM | POA: Diagnosis not present

## 2022-03-26 DIAGNOSIS — M2560 Stiffness of unspecified joint, not elsewhere classified: Secondary | ICD-10-CM | POA: Diagnosis not present

## 2022-03-26 DIAGNOSIS — M4316 Spondylolisthesis, lumbar region: Secondary | ICD-10-CM | POA: Diagnosis not present

## 2022-03-26 DIAGNOSIS — R293 Abnormal posture: Secondary | ICD-10-CM | POA: Diagnosis not present

## 2022-03-29 DIAGNOSIS — H2512 Age-related nuclear cataract, left eye: Secondary | ICD-10-CM | POA: Diagnosis not present

## 2022-04-02 DIAGNOSIS — Z6832 Body mass index (BMI) 32.0-32.9, adult: Secondary | ICD-10-CM | POA: Diagnosis not present

## 2022-04-02 DIAGNOSIS — K5900 Constipation, unspecified: Secondary | ICD-10-CM | POA: Diagnosis not present

## 2022-04-02 DIAGNOSIS — R1032 Left lower quadrant pain: Secondary | ICD-10-CM | POA: Diagnosis not present

## 2022-04-05 DIAGNOSIS — Z6832 Body mass index (BMI) 32.0-32.9, adult: Secondary | ICD-10-CM | POA: Diagnosis not present

## 2022-04-05 DIAGNOSIS — K5900 Constipation, unspecified: Secondary | ICD-10-CM | POA: Diagnosis not present

## 2022-04-17 DIAGNOSIS — H02846 Edema of left eye, unspecified eyelid: Secondary | ICD-10-CM | POA: Diagnosis not present

## 2022-04-17 DIAGNOSIS — H02843 Edema of right eye, unspecified eyelid: Secondary | ICD-10-CM | POA: Diagnosis not present

## 2022-04-29 NOTE — Progress Notes (Unsigned)
Cardiology Office Note:    Date:  05/01/2022   ID:  Cathy Walker, DOB 01-Apr-1951, MRN [id number]  PCP:  Ernestene Kiel, MD  Cardiologist:  Shirlee More, MD    Referring MD: Ernestene Kiel, MD    ASSESSMENT:    1. Agatston coronary artery calcium score between 200 and 399   2. Paroxysmal atrial fibrillation (HCC)   3. Atypical atrial flutter ([place])   4. Essential hypertension   5. Mixed hyperlipidemia    PLAN:    In order of problems listed above:  She is much better following EP catheter ablation I would call her clinical success due to follow-up with EP next month regarding the question of ongoing antiarrhythmic drug and anticoagulation. High risk with diabetes elevated coronary calcium score and resume his statin if ineffective bempedoic acid or PCSK9 inhibitor Stable hypertension continue carvedilol   Next appointment: Reviewed good 1 year   Medication Adjustments/Labs and Tests Ordered: Current medicines are reviewed at length with the patient today.  Concerns regarding medicines are outlined above.  No orders of the defined types were placed in this encounter.  No orders of the defined types were placed in this encounter.   Chief Complaint  Patient presents with   Follow-up   Atrial Fibrillation    History of Present Illness:    Cathy Walker is a 71 y.o. female with a hx of paroxysmal atrial fibrillation and atypical atrial flutter on flecainide anticoagulated and hypertension last seen by me 10/25/2021.  She had EP catheter ablation for atrial arrhythmia 12/14/2021. EKG 01/11/2022 with EP independently reviewed sinus rhythm first-degree AV block poor R wave progression versus old anteroseptal MI. Cardiac CTA was performed in preparation for atrial fibrillation catheter ablation reported 12/08/2021 there is no left atrial appendage thrombus coronary artery calcium score is 385/86 percentile and she had aortic atherosclerosis.  Compliance with diet,  lifestyle and medications: Yes  She is doing much better and 1 episode of atrial fibrillation rate was controlled used to beta-blocker but I reviewed the mobile Kardia strips 03/14/2022.  Other strip showed APCs Overall feeling better she had a fall trauma and a fracture of the vertebrae. She stopped taking rosuvastatin with muscle pain Is very high calcium score diabetic and will try challenging with pravastatin if intolerant she need a PCSK9 inhibitor or bempedoic acid No edema orthopnea angina or syncope No bleeding complication of her anticoagulant She intends to ask EP if she can come off either flecainide or anticoagulant Past Medical History:  Diagnosis Date   Acute hypoxemic respiratory failure ([place]) 06/24/2018   Aortic atherosclerosis ([place]) 02/26/2017   Bradycardia 04/18/2018   Cardiogenic shock ([place]) 22/63/3354   Complication of anesthesia    "caine" intolerance   Essential hypertension 04/18/2018   GERD (gastroesophageal reflux disease) 04/07/2018   HLD (hyperlipidemia) 04/07/2018   Hypercalcemia 56/25/6389   Metabolic acidosis with respiratory acidosis 06/24/2018   Osteoarthritis of spine 02/21/2017   PSVT (paroxysmal supraventricular tachycardia) ([place]) 06/03/2018   Shock liver 06/24/2018   Type 2 diabetes mellitus without complication ([place]) 37/34/2876   Unspecified asthma, uncomplicated 81/15/7262   Vitamin D deficiency 05/09/2016    Past Surgical History:  Procedure Laterality Date   ABDOMINAL HYSTERECTOMY     ATRIAL FIBRILLATION ABLATION N/A 12/14/2021   Procedure: ATRIAL FIBRILLATION ABLATION;  Surgeon: Vickie Epley, MD;  Location: [facility];  Service: Cardiovascular;  Laterality: N/A;   CARPOMETACARPEL SUSPENSION PLASTY Right 07/06/2021   Procedure: RIGHT THUMB TRAPEZIECTOMY  SUSPENSION PLASTY;  Surgeon: Leanora Cover, MD;  Location: [facility];  Service: Orthopedics;  Laterality: Right;   INSERT / REPLACE / REMOVE PACEMAKER     KNEE  SURGERY Left    TRIGGER FINGER RELEASE Right 07/06/2021   Procedure: RELEASE TRIGGER FINGER/A-1 PULLEY RIGHT LONG TRIGGER;  Surgeon: Leanora Cover, MD;  Location: [facility];  Service: Orthopedics;  Laterality: Right;    Current Medications: Current Meds  Medication Sig   acyclovir (ZOVIRAX) 400 MG tablet Take 400 mg by mouth daily.   albuterol (PROVENTIL) (2.5 MG/3ML) 0.083% nebulizer solution Take 2.5 mg by nebulization every 6 (six) hours as needed for wheezing or shortness of breath.   albuterol (VENTOLIN HFA) 108 (90 Base) MCG/ACT inhaler Inhale 2 puffs into the lungs every 6 (six) hours as needed for wheezing or shortness of breath.   ALPRAZolam (XANAX) 0.25 MG tablet Take 0.25 mg by mouth 2 (two) times daily as needed for anxiety.   baclofen (LIORESAL) 10 MG tablet Take 10 mg by mouth 3 (three) times daily as needed for muscle spasms.   carvedilol (COREG) 12.5 MG tablet Take 12.5 mg by mouth 2 (two) times daily with a meal.   carvedilol (COREG) 6.25 MG tablet Take 1 tablet (6.25 mg total) by mouth 2 (two) times daily as needed (palpitations).   chlorthalidone (HYGROTON) 50 MG tablet Take 25 mg by mouth daily.   Cholecalciferol (D3) 50 MCG (2000 UT) TABS Take 2,000 Units by mouth in the morning and at bedtime.   diclofenac Sodium (VOLTAREN) 1 % GEL Apply 1 application topically 2 (two) times daily as needed (pain).   ELIQUIS 5 MG TABS tablet Take 1 tablet (5 mg total) by mouth 2 (two) times daily.   flecainide (TAMBOCOR) 50 MG tablet Take 1 tablet (50 mg total) by mouth 2 (two) times daily.   FREESTYLE LITE test strip 1 each 2 (two) times daily.   furosemide (LASIX) 40 MG tablet Take 1 tablet (40 mg total) by mouth daily as needed for fluid or edema.   glipiZIDE (GLUCOTROL XL) 10 MG 24 hr tablet Take 10 mg by mouth 2 (two) times daily.   hydrALAZINE (APRESOLINE) 50 MG tablet Take 50 mg by mouth 2 (two) times daily.   Magnesium Cl-Calcium Carbonate (SLOW-MAG PO) Take 1  tablet by mouth in the morning and at bedtime.   montelukast (SINGULAIR) 10 MG tablet Take 10 mg by mouth at bedtime.   nitroGLYCERIN (NITROSTAT) 0.4 MG SL tablet Place 0.4 mg under the tongue every 5 (five) minutes as needed for chest pain.   nystatin cream (MYCOSTATIN) Apply 1 application topically 2 (two) times daily as needed for dry skin.   omeprazole (PRILOSEC) 40 MG capsule Take 40 mg by mouth daily.   oxymetazoline (AFRIN) 0.05 % nasal spray Place 1 spray into both nostrils 2 (two) times daily as needed for congestion.   pantoprazole (PROTONIX) 40 MG tablet Take 1 tablet (40 mg total) by mouth daily.   pioglitazone (ACTOS) 30 MG tablet Take 30 mg by mouth daily.   potassium chloride SA (KLOR-CON) 20 MEQ tablet Take 20 mEq by mouth 3 (three) times daily.   rosuvastatin (CRESTOR) 5 MG tablet Take 5 mg by mouth every Monday.   Semaglutide,0.25 or 0.'5MG'$ /DOS, (OZEMPIC, 0.25 OR 0.5 MG/DOSE,) 2 MG/1.5ML SOPN Inject 0.5 mg into the skin every Monday.   temazepam (RESTORIL) 30 MG capsule Take 30 mg by mouth at bedtime.   tiZANidine (ZANAFLEX) 4 MG tablet Take 4 mg  by mouth every 8 (eight) hours as needed for muscle spasms.   traMADol (ULTRAM) 50 MG tablet Take 50 mg by mouth 3 (three) times daily as needed for severe pain.     Allergies:   Angiotensin receptor blockers, Codeine, Latex, Meperidine, Zithromax [azithromycin], Exforge [amlodipine besylate-valsartan], and Sumatriptan   Social History   Socioeconomic History   Marital status: Single    Spouse name: Not on file   Number of children: Not on file   Years of education: Not on file   Highest education level: Not on file  Occupational History   Not on file  Tobacco Use   Smoking status: Former    Types: Cigarettes    Quit date: 11/27/1983    Years since quitting: 38.4    Passive exposure: Past   Smokeless tobacco: Never   Tobacco comments:    Former smoker 01/11/22  Vaping Use   Vaping Use: Never used  Substance and Sexual  Activity   Alcohol use: Never   Drug use: Not Currently   Sexual activity: Not on file  Other Topics Concern   Not on file  Social History Narrative   Not on file   Social Determinants of Health   Financial Resource Strain: Not on file  Food Insecurity: Not on file  Transportation Needs: Not on file  Physical Activity: Not on file  Stress: Not on file  Social Connections: Not on file     Family History: The patient's family history includes AAA (abdominal aortic aneurysm) in her mother; CAD in her father; Diabetes in her father; Heart attack in her father; Hypertension in her father and mother; Hyperthyroidism in her mother; Stroke in her father. ROS:   Please see the history of present illness.    All other systems reviewed and are negative.  EKGs/Labs/Other Studies Reviewed:    The following studies were reviewed today:  Echocardiogram performed 08/15/2021: Normal left ventricular size moderate LVH grade 1 diastolic function normal systolic function EF 60 to 65% Normal right ventricular size function and pulmonary artery pressure. Left and right atrium are normal in size No significant valvular abnormality.  EKG:  EKG ordered today and personally reviewed.  The ekg ordered today demonstrates sinus remote rhythm similar pattern R wave progression   Recent Labs: 11/30/2021: BUN 15; Creatinine, Ser 0.82; Hemoglobin 13.4; Platelets 293; Potassium 3.5; Sodium 139  Recent Lipid Panel No results found for: CHOL, TRIG, HDL, CHOLHDL, VLDL, LDLCALC, LDLDIRECT  Physical Exam:    VS:  BP 112/64 (BP Location: Right Arm, Patient Position: Sitting)   Pulse 71   Ht 5' (1.524 m)   Wt 170 lb 3.2 oz (77.2 kg)   SpO2 96%   BMI 33.24 kg/m     Wt Readings from Last 3 Encounters:  05/01/22 170 lb 3.2 oz (77.2 kg)  01/11/22 168 lb 3.2 oz (76.3 kg)  12/14/21 165 lb (74.8 kg)     GEN:  Well nourished, well developed in no acute distress HEENT: Normal NECK: No JVD; No carotid  bruits LYMPHATICS: No lymphadenopathy CARDIAC: RRR, no murmurs, rubs, gallops RESPIRATORY:  Clear to auscultation without rales, wheezing or rhonchi  ABDOMEN: Soft, non-tender, non-distended MUSCULOSKELETAL:  No edema; No deformity  SKIN: Warm and dry NEUROLOGIC:  Alert and oriented x 3 PSYCHIATRIC:  Normal affect    Signed, Shirlee More, MD  05/01/2022 12:55 PM    [facility]

## 2022-05-01 ENCOUNTER — Encounter: Payer: Self-pay | Admitting: Cardiology

## 2022-05-01 ENCOUNTER — Ambulatory Visit (INDEPENDENT_AMBULATORY_CARE_PROVIDER_SITE_OTHER): Payer: HMO | Admitting: Cardiology

## 2022-05-01 VITALS — BP 112/64 | HR 71 | Ht 60.0 in | Wt 170.2 lb

## 2022-05-01 DIAGNOSIS — R931 Abnormal findings on diagnostic imaging of heart and coronary circulation: Secondary | ICD-10-CM | POA: Diagnosis not present

## 2022-05-01 DIAGNOSIS — I484 Atypical atrial flutter: Secondary | ICD-10-CM | POA: Diagnosis not present

## 2022-05-01 DIAGNOSIS — I480 Paroxysmal atrial fibrillation: Secondary | ICD-10-CM | POA: Diagnosis not present

## 2022-05-01 DIAGNOSIS — I10 Essential (primary) hypertension: Secondary | ICD-10-CM | POA: Diagnosis not present

## 2022-05-01 DIAGNOSIS — E782 Mixed hyperlipidemia: Secondary | ICD-10-CM | POA: Diagnosis not present

## 2022-05-01 MED ORDER — PRAVASTATIN SODIUM 20 MG PO TABS: 20.0000 mg | ORAL_TABLET | ORAL | 3 refills | Status: DC

## 2022-05-01 MED ORDER — PRAVASTATIN SODIUM 20 MG PO TABS: 20.0000 mg | ORAL_TABLET | Freq: Every day | ORAL | 3 refills | Status: DC

## 2022-05-01 NOTE — Patient Instructions (Addendum)
Medication Instructions:  Your physician has recommended you make the following change in your medication:   START: Pravastatin 20 mg three days a week (Monday, Wednesday and Friday) STOP: Rosuvastatin  *If you need a refill on your cardiac medications before your next appointment, please call your pharmacy*   Lab Work: None If you have labs (blood work) drawn today and your tests are completely normal, you will receive your results only by: [place] (if you have MyChart) OR A paper copy in the mail If you have any lab test that is abnormal or we need to change your treatment, we will call you to review the results.   Testing/Procedures: None   Follow-Up: At [facility], you and your health needs are our priority.  As part of our continuing mission to provide you with exceptional heart care, we have created designated Provider Care Teams.  These Care Teams include your primary Cardiologist (physician) and Advanced Practice Providers (APPs -  Physician Assistants and Nurse Practitioners) who all work together to provide you with the care you need, when you need it.  We recommend signing up for the patient portal called "MyChart".  Sign up information is provided on this After Visit Summary.  MyChart is used to connect with patients for Virtual Visits (Telemedicine).  Patients are able to view lab/test results, encounter notes, upcoming appointments, etc.  Non-urgent messages can be sent to your provider as well.   To learn more about what you can do with MyChart, go to [email or web address].    Your next appointment:   1 year(s)  The format for your next appointment:   In Person  Provider:   Shirlee More, MD    Other Instructions None  Important Information About [place]

## 2022-05-10 ENCOUNTER — Encounter: Payer: Self-pay | Admitting: Cardiology

## 2022-05-10 ENCOUNTER — Ambulatory Visit (INDEPENDENT_AMBULATORY_CARE_PROVIDER_SITE_OTHER): Payer: HMO | Admitting: Cardiology

## 2022-05-10 ENCOUNTER — Other Ambulatory Visit: Payer: Self-pay | Admitting: Cardiology

## 2022-05-10 VITALS — BP 132/76 | HR 73 | Ht 60.0 in | Wt 170.2 lb

## 2022-05-10 DIAGNOSIS — I483 Typical atrial flutter: Secondary | ICD-10-CM | POA: Diagnosis not present

## 2022-05-10 DIAGNOSIS — I10 Essential (primary) hypertension: Secondary | ICD-10-CM | POA: Diagnosis not present

## 2022-05-10 DIAGNOSIS — I480 Paroxysmal atrial fibrillation: Secondary | ICD-10-CM | POA: Diagnosis not present

## 2022-05-10 DIAGNOSIS — Z79899 Other long term (current) drug therapy: Secondary | ICD-10-CM

## 2022-05-10 NOTE — Telephone Encounter (Signed)
*  STAT* If patient is at the pharmacy, call can be transferred to refill team.   1. Which medications need to be refilled? (please list name of each medication and dose if known) omeprazole (PRILOSEC) 40 MG capsule  2. Which pharmacy/location (including street and city if local pharmacy) is medication to be sent to? [place]  3. Do they need a 30 day or 90 day supply? [place]

## 2022-05-10 NOTE — Progress Notes (Signed)
Electrophysiology Office Follow up Visit Note:    Date:  05/10/2022   ID:  [name], DOB 1951/05/02, MRN [id number]  PCP:  Ernestene Kiel, MD  [place] Cardiologist:  Shirlee More, MD  [facility] Electrophysiologist:  Vickie Epley, MD    Interval History:    Cathy Walker is a 71 y.o. female who presents for a follow up visit. They were last seen in clinic 09/20/2021.  Since their last appointment, they underwent atrial fibrillation ablation on 12/14/21. She most recently followed up with Dr. Bettina Gavia 05/01/22 and was continuing to feel well.   Overall, she is feeling much better. Occasionally she will feel minor palpitations, but this only occurs about once every few weeks or once a month. Her episodes may last for 15-30 minutes, and then spontaneously resolve.  Since her last appointment she reports 2 mechanical falls. One of her falls resulted in a serious spinal injury with misaligned vertebrae. She will need spinal surgery and will need to hold her Eliquis for a few days prior to the procedure.  On Protonix, she is suffering from worsening acid reflux. She states she was doing better on Prilosec. At this time she is using OTC famotidine which has been helping.  Recently she was seen in the ED due to low magnesium. She admits it is likely she had been dehydrated.  She denies any chest pain, shortness of breath, or peripheral edema. No lightheadedness, headaches, syncope, orthopnea, or PND.      Past Medical History:  Diagnosis Date   Acute hypoxemic respiratory failure ([place]) 06/24/2018   Aortic atherosclerosis ([place]) 02/26/2017   Bradycardia 04/18/2018   Cardiogenic shock ([place]) 50/93/2671   Complication of anesthesia    "caine" intolerance   Essential hypertension 04/18/2018   GERD (gastroesophageal reflux disease) 04/07/2018   HLD (hyperlipidemia) 04/07/2018   Hypercalcemia 24/58/0998   Metabolic acidosis with respiratory acidosis 06/24/2018    Osteoarthritis of spine 02/21/2017   PSVT (paroxysmal supraventricular tachycardia) ([place]) 06/03/2018   Shock liver 06/24/2018   Type 2 diabetes mellitus without complication ([place]) 33/82/5053   Unspecified asthma, uncomplicated 97/67/3419   Vitamin D deficiency 05/09/2016    Past Surgical History:  Procedure Laterality Date   ABDOMINAL HYSTERECTOMY     ATRIAL FIBRILLATION ABLATION N/A 12/14/2021   Procedure: ATRIAL FIBRILLATION ABLATION;  Surgeon: Vickie Epley, MD;  Location: [place] [facility];  Service: Cardiovascular;  Laterality: N/A;   CARPOMETACARPEL SUSPENSION PLASTY Right 07/06/2021   Procedure: RIGHT THUMB TRAPEZIECTOMY  SUSPENSION PLASTY;  Surgeon: Leanora Cover, MD;  Location: [place];  Service: Orthopedics;  Laterality: Right;   INSERT / REPLACE / REMOVE PACEMAKER     KNEE SURGERY Left    TRIGGER FINGER RELEASE Right 07/06/2021   Procedure: RELEASE TRIGGER FINGER/A-1 PULLEY RIGHT LONG TRIGGER;  Surgeon: Leanora Cover, MD;  Location: [place];  Service: Orthopedics;  Laterality: Right;    Current Medications: Current Meds  Medication Sig   acyclovir (ZOVIRAX) 400 MG tablet Take 400 mg by mouth daily.   albuterol (PROVENTIL) (2.5 MG/3ML) 0.083% nebulizer solution Take 2.5 mg by nebulization every 6 (six) hours as needed for wheezing or shortness of breath.   albuterol (VENTOLIN HFA) 108 (90 Base) MCG/ACT inhaler Inhale 2 puffs into the lungs every 6 (six) hours as needed for wheezing or shortness of breath.   ALPRAZolam (XANAX) 0.25 MG tablet Take 0.25 mg by mouth 2 (two) times daily as needed for anxiety.   baclofen (LIORESAL) 10  MG tablet Take 10 mg by mouth 3 (three) times daily as needed for muscle spasms.   carvedilol (COREG) 12.5 MG tablet Take 12.5 mg by mouth 2 (two) times daily with a meal.   carvedilol (COREG) 6.25 MG tablet Take 1 tablet (6.25 mg total) by mouth 2 (two) times daily as needed (palpitations).   chlorthalidone  (HYGROTON) 50 MG tablet Take 25 mg by mouth daily.   Cholecalciferol (D3) 50 MCG (2000 UT) TABS Take 2,000 Units by mouth in the morning and at bedtime.   diclofenac Sodium (VOLTAREN) 1 % GEL Apply 1 application topically 2 (two) times daily as needed (pain).   ELIQUIS 5 MG TABS tablet Take 1 tablet (5 mg total) by mouth 2 (two) times daily.   flecainide (TAMBOCOR) 50 MG tablet Take 1 tablet (50 mg total) by mouth 2 (two) times daily.   FREESTYLE LITE test strip 1 each 2 (two) times daily.   furosemide (LASIX) 40 MG tablet Take 1 tablet (40 mg total) by mouth daily as needed for fluid or edema.   glipiZIDE (GLUCOTROL XL) 10 MG 24 hr tablet Take 10 mg by mouth 2 (two) times daily.   hydrALAZINE (APRESOLINE) 50 MG tablet Take 50 mg by mouth 2 (two) times daily.   Magnesium Cl-Calcium Carbonate (SLOW-MAG PO) Take 1 tablet by mouth in the morning and at bedtime.   montelukast (SINGULAIR) 10 MG tablet Take 10 mg by mouth at bedtime.   nitroGLYCERIN (NITROSTAT) 0.4 MG SL tablet Place 0.4 mg under the tongue every 5 (five) minutes as needed for chest pain.   nystatin cream (MYCOSTATIN) Apply 1 application topically 2 (two) times daily as needed for dry skin.   omeprazole (PRILOSEC) 40 MG capsule Take 40 mg by mouth daily.   oxymetazoline (AFRIN) 0.05 % nasal spray Place 1 spray into both nostrils 2 (two) times daily as needed for congestion.   pioglitazone (ACTOS) 30 MG tablet Take 30 mg by mouth daily.   potassium chloride SA (KLOR-CON) 20 MEQ tablet Take 20 mEq by mouth 3 (three) times daily.   pravastatin (PRAVACHOL) 20 MG tablet Take 1 tablet (20 mg total) by mouth 3 (three) times a week.   Semaglutide,0.25 or 0.'5MG'$ /DOS, (OZEMPIC, 0.25 OR 0.5 MG/DOSE,) 2 MG/1.5ML SOPN Inject 0.5 mg into the skin every Monday.   temazepam (RESTORIL) 30 MG capsule Take 30 mg by mouth at bedtime.   tiZANidine (ZANAFLEX) 4 MG tablet Take 4 mg by mouth every 8 (eight) hours as needed for muscle spasms.   traMADol  (ULTRAM) 50 MG tablet Take 50 mg by mouth 3 (three) times daily as needed for severe pain.     Allergies:   Angiotensin receptor blockers, Codeine, Latex, Meperidine, Zithromax [azithromycin], Exforge [amlodipine besylate-valsartan], and Sumatriptan   Social History   Socioeconomic History   Marital status: Widowed    Spouse name: Not on file   Number of children: Not on file   Years of education: Not on file   Highest education level: Not on file  Occupational History   Not on file  Tobacco Use   Smoking status: Former    Types: Cigarettes    Quit date: 11/27/1983    Years since quitting: 38.4    Passive exposure: Past   Smokeless tobacco: Never   Tobacco comments:    Former smoker 01/11/22  Vaping Use   Vaping Use: Never used  Substance and Sexual Activity   Alcohol use: Never   Drug use: Not Currently  Sexual activity: Not on file  Other Topics Concern   Not on file  Social History Narrative   Not on file   Social Determinants of Health   Financial Resource Strain: Not on file  Food Insecurity: Not on file  Transportation Needs: Not on file  Physical Activity: Not on file  Stress: Not on file  Social Connections: Not on file     Family History: The patient's family history includes AAA (abdominal aortic aneurysm) in her mother; CAD in her father; Diabetes in her father; Heart attack in her father; Hypertension in her father and mother; Hyperthyroidism in her mother; Stroke in her father.  ROS:   Please see the history of present illness.    (+) Palpitations (+) Mechanical falls (+) Back pain (+) Knee pain (+) Acid reflux All other systems reviewed and are negative.  EKGs/Labs/Other Studies Reviewed:    The following studies were reviewed today:  12/14/2021  Atrial Fibrillation Ablation: CONCLUSIONS: 1. Successful PVI 2. Successful ablation/isolation of the posterior wall 3. Successful ablation of the cavotricuspid isthmus for atrial flutter 4.  Intracardiac echo reveals trivial pericardial effusion, normal LV function 5. No early apparent complications. 6. Colchicine 0.'6mg'$  PO BID x 5 days 7. Protonix '40mg'$  PO daily x 40 days    12/08/2021  Cardiac CT: FINDINGS: Image quality: Average.   Noise artifact is: Limited.   Pulmonary Veins: There is normal pulmonary vein drainage into the left atrium (2 on the right and 2 on the left) with ostial measurements as follows:   RUPV: Ostium 7.97 mm x 7.37 mm  area 0.423 cm2   RLPV:  Ostium 16.4 mm x 12.6 mm  area 1.58 cm 2   LUPV:  Ostium 17.1 mm x 15.2 mm area 1.90 cm2   LLPV:  Ostium 15.1 mm x 12.9 mm  area 1.47 cm2   Left Atrium: The left atrial size is normal. There is no PFO/ASD. There is no thrombus in the left atrial appendage on contrast or delayed imaging. The esophagus runs in the left atrial midline and is in close proximity to the left lower pulmonary vein ostia.   Coronary Arteries: CAC score of 385, which is 86 percentile for age-, race-, and sex-matched controls. Normal coronary origin. Right dominance. The study was performed without use of NTG and is insufficient for plaque evaluation.   Right Atrium: Right atrial size is within normal limits.   Right Ventricle: The right ventricular cavity is within normal limits.   Left Ventricle: The ventricular cavity size is within normal limits. There are no stigmata of prior infarction. There is no abnormal filling defect.   Pericardium: Normal thickness with no significant effusion or calcium present.   Pulmonary Artery: Normal caliber without proximal filling defect.   Cardiac valves: The aortic valve is trileaflet with minimal calcification. The mitral valve is normal structure without significant calcification.   Aorta: Normal caliber with aortic atherosclerosis noted.   Extra-cardiac findings: See attached radiology report for non-cardiac structures.   IMPRESSION: 1. There is normal pulmonary vein  drainage into the left atrium with ostial measurements above.   2. There is no thrombus in the left atrial appendage.   3. The esophagus runs in the left atrial midline and is in close proximity to the left lower pulmonary vein ostia.   4. No PFO/ASD.   5. Normal coronary origin. Right dominance.   6. CAC score of 385 which is 38 percentile for age-, race-, and sex-matched controls.  7. Aortic atherosclerosis   EKG:  EKG is personally reviewed.  05/10/2022: EKG today shows sinus rhythm with a PR interval of 240 ms and a QRS duration of 86 ms.   Recent Labs: 11/30/2021: BUN 15; Creatinine, Ser 0.82; Hemoglobin 13.4; Platelets 293; Potassium 3.5; Sodium 139   Recent Lipid Panel No results found for: "CHOL", "TRIG", "HDL", "CHOLHDL", "VLDL", "LDLCALC", "LDLDIRECT"  Physical Exam:    VS:  BP 132/76   Pulse 73   Ht 5' (1.524 m)   Wt 170 lb 3.2 oz (77.2 kg)   SpO2 96%   BMI 33.24 kg/m     Wt Readings from Last 3 Encounters:  05/10/22 170 lb 3.2 oz (77.2 kg)  05/01/22 170 lb 3.2 oz (77.2 kg)  01/11/22 168 lb 3.2 oz (76.3 kg)     GEN: Well nourished, well developed in no acute distress HEENT: Normal NECK: No JVD; No carotid bruits LYMPHATICS: No lymphadenopathy CARDIAC: RRR, no murmurs, rubs, gallops RESPIRATORY:  Clear to auscultation without rales, wheezing or rhonchi  ABDOMEN: Soft, non-tender, non-distended MUSCULOSKELETAL:  No edema; No deformity  SKIN: Warm and dry NEUROLOGIC:  Alert and oriented x 3 PSYCHIATRIC:  Normal affect        ASSESSMENT:    1. Paroxysmal atrial fibrillation (HCC)   2. Typical atrial flutter ([place])   3. Essential hypertension   4. Encounter for long-term (current) use of high-risk medication    PLAN:    In order of problems listed above:   #Paroxysmal atrial fibrillation and flutter Doing well after her ablation.  EKG shows sinus rhythm today.  For now recommend continuing flecainide and Coreg in addition to Eliquis for  stroke prophylaxis.  The patient has upcoming procedures including an orthopedic and spinal injection.  It is okay for her to hold her anticoagulant for 2 to 3 days prior to these procedures and restart when felt safe from a surgical perspective.  #Hypertension At goal.  Continue current medication regimen.  Follow-up in 6 months.  If patient continues to do well from a heart rhythm perspective, consider stopping flecainide at next appointment.   Medication Adjustments/Labs and Tests Ordered: Current medicines are reviewed at length with the patient today.  Concerns regarding medicines are outlined above.  Orders Placed This Encounter  Procedures   EKG 12-Lead   No orders of the defined types were placed in this encounter.   I,Mathew Stumpf,acting as a Education administrator for Vickie Epley, MD.,have documented all relevant documentation on the behalf of Vickie Epley, MD,as directed by  Vickie Epley, MD while in the presence of Vickie Epley, MD.  I, Vickie Epley, MD, have reviewed all documentation for this visit. The documentation on 05/10/22 for the exam, diagnosis, procedures, and orders are all accurate and complete.   Signed, Lars Mage, MD, [facility], [facility] 05/10/2022 10:42 PM    Electrophysiology [facility]

## 2022-05-10 NOTE — Patient Instructions (Signed)
Medication Instructions:  Your physician recommends that you continue on your current medications as directed. Please refer to the Current Medication list given to you today. *If you need a refill on your cardiac medications before your next appointment, please call your pharmacy*  Lab Work: None. If you have labs (blood work) drawn today and your tests are completely normal, you will receive your results only by: [place] (if you have MyChart) OR A paper copy in the mail If you have any lab test that is abnormal or we need to change your treatment, we will call you to review the results.  Testing/Procedures: None.  Follow-Up: At [facility], you and your health needs are our priority.  As part of our continuing mission to provide you with exceptional heart care, we have created designated Provider Care Teams.  These Care Teams include your primary Cardiologist (physician) and Advanced Practice Providers (APPs -  Physician Assistants and Nurse Practitioners) who all work together to provide you with the care you need, when you need it.  Your physician wants you to follow-up in: 6 months with Lars Mage, MD or one of the following Advanced Practice Providers on your designated Care Team:    Tommye Standard, [place] Le[place] "Jonni Sanger" [place], [place]   You will receive a reminder letter in the mail two months in advance. If you don't receive a letter, please call our office to schedule the follow-up appointment.  We recommend signing up for the patient portal called "MyChart".  Sign up information is provided on this After Visit Summary.  MyChart is used to connect with patients for Virtual Visits (Telemedicine).  Patients are able to view lab/test results, encounter notes, upcoming appointments, etc.  Non-urgent messages can be sent to your provider as well.   To learn more about what you can do with MyChart, go to [email or web address].    Any Other Special Instructions Will Be Listed  Below (If Applicable).

## 2022-05-10 NOTE — Telephone Encounter (Signed)
Pt is requesting a refill on omeprazole. Would Dr. Curt Bears like to refill this medication? Please advise

## 2022-06-01 DIAGNOSIS — H26493 Other secondary cataract, bilateral: Secondary | ICD-10-CM | POA: Diagnosis not present

## 2022-06-04 DIAGNOSIS — E785 Hyperlipidemia, unspecified: Secondary | ICD-10-CM | POA: Diagnosis not present

## 2022-06-04 DIAGNOSIS — G9581 Conus medullaris syndrome: Secondary | ICD-10-CM | POA: Diagnosis not present

## 2022-06-04 DIAGNOSIS — I480 Paroxysmal atrial fibrillation: Secondary | ICD-10-CM | POA: Diagnosis not present

## 2022-06-04 DIAGNOSIS — E782 Mixed hyperlipidemia: Secondary | ICD-10-CM | POA: Diagnosis not present

## 2022-06-04 DIAGNOSIS — I10 Essential (primary) hypertension: Secondary | ICD-10-CM | POA: Diagnosis not present

## 2022-06-04 DIAGNOSIS — N1831 Chronic kidney disease, stage 3a: Secondary | ICD-10-CM | POA: Diagnosis not present

## 2022-06-04 DIAGNOSIS — Z6832 Body mass index (BMI) 32.0-32.9, adult: Secondary | ICD-10-CM | POA: Diagnosis not present

## 2022-06-04 DIAGNOSIS — G43909 Migraine, unspecified, not intractable, without status migrainosus: Secondary | ICD-10-CM | POA: Diagnosis not present

## 2022-06-04 DIAGNOSIS — E1169 Type 2 diabetes mellitus with other specified complication: Secondary | ICD-10-CM | POA: Diagnosis not present

## 2022-06-25 DIAGNOSIS — E1169 Type 2 diabetes mellitus with other specified complication: Secondary | ICD-10-CM | POA: Diagnosis not present

## 2022-06-25 DIAGNOSIS — N1831 Chronic kidney disease, stage 3a: Secondary | ICD-10-CM | POA: Diagnosis not present

## 2022-07-26 DIAGNOSIS — N1831 Chronic kidney disease, stage 3a: Secondary | ICD-10-CM | POA: Diagnosis not present

## 2022-07-26 DIAGNOSIS — E1169 Type 2 diabetes mellitus with other specified complication: Secondary | ICD-10-CM | POA: Diagnosis not present

## 2022-07-31 DIAGNOSIS — J329 Chronic sinusitis, unspecified: Secondary | ICD-10-CM | POA: Diagnosis not present

## 2022-07-31 DIAGNOSIS — Z6831 Body mass index (BMI) 31.0-31.9, adult: Secondary | ICD-10-CM | POA: Diagnosis not present

## 2022-07-31 DIAGNOSIS — J40 Bronchitis, not specified as acute or chronic: Secondary | ICD-10-CM | POA: Diagnosis not present

## 2022-08-15 ENCOUNTER — Telehealth: Payer: Self-pay | Admitting: Cardiology

## 2022-08-15 DIAGNOSIS — Z6831 Body mass index (BMI) 31.0-31.9, adult: Secondary | ICD-10-CM | POA: Diagnosis not present

## 2022-08-15 DIAGNOSIS — J329 Chronic sinusitis, unspecified: Secondary | ICD-10-CM | POA: Diagnosis not present

## 2022-08-15 DIAGNOSIS — M549 Dorsalgia, unspecified: Secondary | ICD-10-CM | POA: Diagnosis not present

## 2022-08-15 DIAGNOSIS — G8929 Other chronic pain: Secondary | ICD-10-CM | POA: Diagnosis not present

## 2022-08-15 DIAGNOSIS — G4700 Insomnia, unspecified: Secondary | ICD-10-CM | POA: Diagnosis not present

## 2022-08-15 NOTE — Telephone Encounter (Signed)
Recommendations reviewed with pt as per Dr. Munley's note.  Pt verbalized understanding and had no additional questions.  

## 2022-08-15 NOTE — Telephone Encounter (Signed)
Pt c/o medication issue:  1. Name of Medication: Amoxicillin- pot clavulanate [id number]  2. How are you currently taking this medication (dosage and times per day)? Not taking   3. Are you having a reaction (difficulty breathing--STAT)? no  4. What is your medication issue? Patient states she was told not to take combination medications. She would like to know if it is okay for her to take the amoxicillin-pot clavulanate.

## 2022-10-22 ENCOUNTER — Other Ambulatory Visit: Payer: Self-pay | Admitting: Cardiology

## 2022-11-05 ENCOUNTER — Other Ambulatory Visit: Payer: Self-pay | Admitting: Cardiology

## 2022-11-05 DIAGNOSIS — I480 Paroxysmal atrial fibrillation: Secondary | ICD-10-CM

## 2022-11-05 NOTE — Telephone Encounter (Signed)
Eliquis '5mg'$  refill request received. Patient is 71 years old, weight-77.2kg, Crea-0.82 on 11/30/2021, Diagnosis-Afib, and last seen by Dr. Bettina Gavia on 05/10/2022. Dose is appropriate based on dosing criteria. Will send in refill to requested pharmacy.

## 2022-11-13 ENCOUNTER — Ambulatory Visit: Payer: HMO | Admitting: Cardiology

## 2022-11-27 DIAGNOSIS — U071 COVID-19: Secondary | ICD-10-CM | POA: Diagnosis not present

## 2022-11-27 DIAGNOSIS — J329 Chronic sinusitis, unspecified: Secondary | ICD-10-CM | POA: Diagnosis not present

## 2022-11-27 DIAGNOSIS — J40 Bronchitis, not specified as acute or chronic: Secondary | ICD-10-CM | POA: Diagnosis not present

## 2022-12-11 ENCOUNTER — Telehealth: Payer: Self-pay | Admitting: Cardiology

## 2022-12-11 NOTE — Telephone Encounter (Signed)
Pt has [place] state health plan and [employer] Medicare insurance. [phone] pricing is with her Medicare plan, $10 would be with copay card used with state health. She was running out of med and picked up rx on Saturday and paid $47.  Called her pharmacy, they stated they've always just billed her HTA, copay last year was $10.35 which sounds more like low income subsidy extra help.  I gave them her [employer] info, when they reran rx it was still $47. Will activate copay card and see if it works with her state health plan insurance.  Activated copay card- BIN [id number] PCN [id number] GRP [id number] ID [id number]  Confirmed $10 copay now, they will reimburse pt the difference. She was very appreciative of the assistance.  I asked what her Ozempic copay has been since she can use copay cards with any branded med with her state health plan insurance. She reports it had been free through a program. Advised her if needed, she can sign up for $25 copay card online in the future.

## 2022-12-11 NOTE — Telephone Encounter (Signed)
Pt c/o medication issue:  1. Name of Medication:   ELIQUIS 5 MG TABS tablet    2. How are you currently taking this medication (dosage and times per day)?   3. Are you having a reaction (difficulty breathing--STAT)?   4. What is your medication issue?   Patient states this year her Eliquis increased from $10/month to $47/month. She states her insurance recommends [place].

## 2022-12-24 ENCOUNTER — Ambulatory Visit: Payer: HMO | Admitting: Cardiology

## 2022-12-26 DIAGNOSIS — E782 Mixed hyperlipidemia: Secondary | ICD-10-CM | POA: Diagnosis not present

## 2022-12-26 DIAGNOSIS — E1169 Type 2 diabetes mellitus with other specified complication: Secondary | ICD-10-CM | POA: Diagnosis not present

## 2023-01-14 DIAGNOSIS — I480 Paroxysmal atrial fibrillation: Secondary | ICD-10-CM | POA: Diagnosis not present

## 2023-01-14 DIAGNOSIS — E785 Hyperlipidemia, unspecified: Secondary | ICD-10-CM | POA: Diagnosis not present

## 2023-01-14 DIAGNOSIS — N1831 Chronic kidney disease, stage 3a: Secondary | ICD-10-CM | POA: Diagnosis not present

## 2023-01-14 DIAGNOSIS — D509 Iron deficiency anemia, unspecified: Secondary | ICD-10-CM | POA: Diagnosis not present

## 2023-01-14 DIAGNOSIS — I10 Essential (primary) hypertension: Secondary | ICD-10-CM | POA: Diagnosis not present

## 2023-01-14 DIAGNOSIS — E1169 Type 2 diabetes mellitus with other specified complication: Secondary | ICD-10-CM | POA: Diagnosis not present

## 2023-01-14 DIAGNOSIS — Z6832 Body mass index (BMI) 32.0-32.9, adult: Secondary | ICD-10-CM | POA: Diagnosis not present

## 2023-01-14 DIAGNOSIS — R195 Other fecal abnormalities: Secondary | ICD-10-CM | POA: Diagnosis not present

## 2023-01-31 NOTE — Progress Notes (Signed)
Cardiology Office Note:    Date:  02/01/2023   ID:  Cathy Walker, DOB [birth year], MRN [id number]  PCP:  Ernestene Kiel, MD  Cardiologist:  Shirlee More, MD    Referring MD: Ernestene Kiel, MD    ASSESSMENT:    1. Paroxysmal atrial fibrillation (HCC)   2. Typical atrial flutter ([place])   3. High risk medication use   4. Chronic anticoagulation   5. Agatston coronary artery calcium score between 200 and 399   6. Mixed hyperlipidemia    PLAN:    In order of problems listed above:  [name] continues to do well following EP catheter ablation and on antiarrhythmic drug therapy maintaining sinus rhythm continue her flecainide and anticoagulation. She is on lipid-lowering therapy with an elevated coronary calcium score lipids are followed in her PCP office and continue pravastatin   Next appointment: 6 monhts   Medication Adjustments/Labs and Tests Ordered: Current medicines are reviewed at length with the patient today.  Concerns regarding medicines are outlined above.  Orders Placed This Encounter  Procedures   EKG 12-Lead   No orders of the defined types were placed in this encounter.   Chief complaint follow-up CAD   History of Present Illness:    Cathy Walker is a 72 y.o. female with a hx of paroxysmal atrial fibrillation and atrial flutter maintaining sinus rhythm with flecainide chronic anticoagulation hypertension and previous EP catheter ablation January 2023 last seen 05/01/2022.  She also has a very elevated coronary calcium score.  Compliance with diet, lifestyle and medications: Yes he can stop his  She continues to do well tolerates flecainide without side effects has a smart watch without any heart rhythm heart rate alerts and has had no symptomatic episodes of atrial fibrillation. She is not having edema shortness of breath orthopnea chest pain palpitation or syncope She tolerates her anticoagulant without bleeding and lipid-lowering therapy with  pravastatin without muscle pain or weakness She has had more recent labs done at her PCP office with a last lipid profile I can see is a cholesterol 229 LDL 143 A1c 6.8% 10/09/2022 Past Medical History:  Diagnosis Date   Acute hypoxemic respiratory failure ([place]) 06/24/2018   Aortic atherosclerosis ([place]) 02/26/2017   Bradycardia 04/18/2018   Cardiogenic shock ([place]) 123XX123   Complication of anesthesia    "caine" intolerance   Essential hypertension 04/18/2018   GERD (gastroesophageal reflux disease) 04/07/2018   HLD (hyperlipidemia) 04/07/2018   Hypercalcemia [id number]   Metabolic acidosis with respiratory acidosis 06/24/2018   Osteoarthritis of spine 02/21/2017   PSVT (paroxysmal supraventricular tachycardia) 06/03/2018   Shock liver 06/24/2018   Type 2 diabetes mellitus without complication ([place]) 123XX123   Unspecified asthma, uncomplicated [id number]   Vitamin D deficiency 05/09/2016    Past Surgical History:  Procedure Laterality Date   ABDOMINAL HYSTERECTOMY     ATRIAL FIBRILLATION ABLATION N/A 12/14/2021   Procedure: ATRIAL FIBRILLATION ABLATION;  Surgeon: Vickie Epley, MD;  Location: [facility];  Service: Cardiovascular;  Laterality: N/A;   CARPOMETACARPEL SUSPENSION PLASTY Right 07/06/2021   Procedure: RIGHT THUMB TRAPEZIECTOMY  SUSPENSION PLASTY;  Surgeon: Leanora Cover, MD;  Location: [facility];  Service: Orthopedics;  Laterality: Right;   INSERT / REPLACE / REMOVE PACEMAKER     KNEE SURGERY Left    TRIGGER FINGER RELEASE Right 07/06/2021   Procedure: RELEASE TRIGGER FINGER/A-1 PULLEY RIGHT LONG TRIGGER;  Surgeon: Leanora Cover, MD;  Location: [facility];  Service: Orthopedics;  Laterality: Right;  Current Medications: Current Meds  Medication Sig   acyclovir (ZOVIRAX) 400 MG tablet Take 400 mg by mouth daily.   albuterol (PROVENTIL) (2.5 MG/3ML) 0.083% nebulizer solution Take 2.5 mg by nebulization every 6 (six) hours  as needed for wheezing or shortness of breath.   albuterol (VENTOLIN HFA) 108 (90 Base) MCG/ACT inhaler Inhale 2 puffs into the lungs every 6 (six) hours as needed for wheezing or shortness of breath.   ALPRAZolam (XANAX) 0.25 MG tablet Take 0.25 mg by mouth 2 (two) times daily as needed for anxiety.   baclofen (LIORESAL) 10 MG tablet Take 10 mg by mouth 3 (three) times daily as needed for muscle spasms.   carvedilol (COREG) 12.5 MG tablet Take 12.5 mg by mouth 2 (two) times daily with a meal.   carvedilol (COREG) 6.25 MG tablet Take 1 tablet (6.25 mg total) by mouth 2 (two) times daily as needed (palpitations).   chlorthalidone (HYGROTON) 50 MG tablet Take 25 mg by mouth daily.   Cholecalciferol (D3) 50 MCG (2000 UT) TABS Take 2,000 Units by mouth in the morning and at bedtime.   Continuous Blood Gluc Sensor (FREESTYLE LIBRE 2 SENSOR) MISC 1 Units every 14 (fourteen) days.   diclofenac Sodium (VOLTAREN) 1 % GEL Apply 1 application topically 2 (two) times daily as needed (pain).   ELIQUIS 5 MG TABS tablet TAKE 1 TABLET BY MOUTH TWICE(2) DAILY   FEROSUL 325 (65 Fe) MG tablet Take 325 mg by mouth daily.   flecainide (TAMBOCOR) 50 MG tablet Take 1 tablet (50 mg total) by mouth 2 (two) times daily.   furosemide (LASIX) 40 MG tablet Take 1 tablet (40 mg total) by mouth daily as needed for fluid or edema.   glipiZIDE (GLUCOTROL XL) 5 MG 24 hr tablet Take 5 mg by mouth daily.   hydrALAZINE (APRESOLINE) 50 MG tablet Take 50 mg by mouth 2 (two) times daily.   Magnesium Cl-Calcium Carbonate (SLOW-MAG PO) Take 1 tablet by mouth in the morning and at bedtime.   mirtazapine (REMERON) 15 MG tablet Take 15 mg by mouth daily.   montelukast (SINGULAIR) 10 MG tablet Take 10 mg by mouth at bedtime.   nitroGLYCERIN (NITROSTAT) 0.4 MG SL tablet Place 0.4 mg under the tongue every 5 (five) minutes as needed for chest pain.   nystatin cream (MYCOSTATIN) Apply 1 application topically 2 (two) times daily as needed for  dry skin.   omeprazole (PRILOSEC) 40 MG capsule Take 40 mg by mouth daily.   oxymetazoline (AFRIN) 0.05 % nasal spray Place 1 spray into both nostrils 2 (two) times daily as needed for congestion.   pioglitazone (ACTOS) 30 MG tablet Take 30 mg by mouth daily.   potassium chloride SA (KLOR-CON) 20 MEQ tablet Take 20 mEq by mouth 3 (three) times daily.   pravastatin (PRAVACHOL) 20 MG tablet Take 1 tablet (20 mg total) by mouth 3 (three) times a week.   Semaglutide,0.25 or 0.'5MG'$ /DOS, (OZEMPIC, 0.25 OR 0.5 MG/DOSE,) 2 MG/1.5ML SOPN Inject 0.5 mg into the skin every Monday.   traMADol (ULTRAM) 50 MG tablet Take 50 mg by mouth 3 (three) times daily as needed for severe pain.     Allergies:   Angiotensin receptor blockers, Codeine, Latex, Meperidine, Zithromax [azithromycin], Exforge [amlodipine besylate-valsartan], and Sumatriptan   Social History   Socioeconomic History   Marital status: Widowed    Spouse name: Not on file   Number of children: Not on file   Years of education: Not on file  Highest education level: Not on file  Occupational History   Not on file  Tobacco Use   Smoking status: Former    Types: Cigarettes    Quit date: 11/27/1983    Years since quitting: 39.2    Passive exposure: Past   Smokeless tobacco: Never   Tobacco comments:    Former smoker 01/11/22  Vaping Use   Vaping Use: Never used  Substance and Sexual Activity   Alcohol use: Never   Drug use: Not Currently   Sexual activity: Not on file  Other Topics Concern   Not on file  Social History Narrative   Not on file   Social Determinants of Health   Financial Resource Strain: Not on file  Food Insecurity: Not on file  Transportation Needs: Not on file  Physical Activity: Not on file  Stress: Not on file  Social Connections: Not on file     Family History: The patient's family history includes AAA (abdominal aortic aneurysm) in her mother; CAD in her father; Diabetes in her father; Heart attack in  her father; Hypertension in her father and mother; Hyperthyroidism in her mother; Stroke in her father. ROS:   Please see the history of present illness.    All other systems reviewed and are negative.  EKGs/Labs/Other Studies Reviewed:    The following studies were reviewed today:  Cardiac Studies & Procedures     STRESS TESTS  MYOCARDIAL PERFUSION IMAGING 10/12/2021   ECHOCARDIOGRAM  ECHOCARDIOGRAM COMPLETE 08/15/2021  Narrative ECHOCARDIOGRAM REPORT    Patient Name:   [facility] Date of Exam: 08/15/2021 Medical Rec #:  [id number]     Height:       60.0 in Accession #:    [id number]    Weight:       171.0 lb Date of Birth:  [birth year]      BSA:          1.746 m Patient Age:    89 years      BP:           120/76 mmHg Patient Gender: F             HR:           79 bpm. Exam Location:  [facility]  Procedure: 2D Echo  Indications:    Atrial flutter, unspecified type ([place]) [id number] (ICD-10-CM)]  History:        Patient has no prior history of Echocardiogram examinations. Arrythmias:Atrial flutter; Risk Factors:Hypertension and Diabetes.  Sonographer:    Luane School RDCS Referring Phys: [id number] [place]   1. Left ventricular ejection fraction, by estimation, is 60 to 65%. The left ventricle has normal function. The left ventricle has no regional wall motion abnormalities. There is moderate left ventricular hypertrophy. Left ventricular diastolic parameters are consistent with Grade I diastolic dysfunction (impaired relaxation). 2. Right ventricular systolic function is normal. The right ventricular size is normal. There is normal pulmonary artery systolic pressure. 3. The mitral valve is normal in structure. No evidence of mitral valve regurgitation. No evidence of mitral stenosis. 4. The aortic valve is normal in structure. Aortic valve regurgitation is not visualized. No aortic stenosis is present. 5. The inferior vena cava is normal in size with greater  than 50% respiratory variability, suggesting right atrial pressure of 3 mmHg.  FINDINGS Left Ventricle: Left ventricular ejection fraction, by estimation, is 60 to 65%. The left ventricle has normal function. The left ventricle has no regional wall motion abnormalities.  The left ventricular internal cavity size was normal in size. There is moderate left ventricular hypertrophy. Left ventricular diastolic parameters are consistent with Grade I diastolic dysfunction (impaired relaxation).  Right Ventricle: The right ventricular size is normal. No increase in right ventricular wall thickness. Right ventricular systolic function is normal. There is normal pulmonary artery systolic pressure. The tricuspid regurgitant velocity is 1.12 m/s, and with an assumed right atrial pressure of 3 mmHg, the estimated right ventricular systolic pressure is 8.0 mmHg.  Left Atrium: Left atrial size was normal in size.  Right Atrium: Right atrial size was normal in size.  Pericardium: There is no evidence of pericardial effusion.  Mitral Valve: The mitral valve is normal in structure. Mild mitral annular calcification. No evidence of mitral valve regurgitation. No evidence of mitral valve stenosis.  Tricuspid Valve: The tricuspid valve is normal in structure. Tricuspid valve regurgitation is not demonstrated. No evidence of tricuspid stenosis.  Aortic Valve: The aortic valve is normal in structure. Aortic valve regurgitation is not visualized. No aortic stenosis is present.  Pulmonic Valve: The pulmonic valve was normal in structure. Pulmonic valve regurgitation is not visualized. No evidence of pulmonic stenosis.  Aorta: The aortic root is normal in size and structure.  Venous: The inferior vena cava is normal in size with greater than 50% respiratory variability, suggesting right atrial pressure of 3 mmHg.  IAS/Shunts: No atrial level shunt detected by color flow Doppler.   LEFT VENTRICLE PLAX 2D LVIDd:          2.83 cm  Diastology LVIDs:         2.20 cm  LV e' medial:    5.98 cm/s LV PW:         1.65 cm  LV E/e' medial:  11.0 LV IVS:        1.77 cm  LV e' lateral:   5.87 cm/s LVOT diam:     2.00 cm  LV E/e' lateral: 11.2 LV SV:         68 LV SV Index:   39 LVOT Area:     3.14 cm   RIGHT VENTRICLE             IVC RV S prime:     11.20 cm/s  IVC diam: 1.30 cm TAPSE (M-mode): 1.8 cm  LEFT ATRIUM             Index       RIGHT ATRIUM          Index LA diam:        2.90 cm 1.66 cm/m  RA Area:     8.13 cm LA Vol (A2C):   27.4 ml 15.69 ml/m RA Volume:   12.30 ml 7.04 ml/m LA Vol (A4C):   29.7 ml 17.01 ml/m LA Biplane Vol: 27.1 ml 15.52 ml/m AORTIC VALVE LVOT Vmax:   122.00 cm/s LVOT Vmean:  85.800 cm/s LVOT VTI:    0.217 m  AORTA Ao Root diam: 2.90 cm Ao Asc diam:  3.35 cm Ao Desc diam: 2.20 cm  MITRAL VALVE               TRICUSPID VALVE MV Area (PHT): 3.50 cm    TR Peak grad:   5.0 mmHg MV Decel Time: 217 msec    TR Vmax:        112.00 cm/s MV E velocity: 65.60 cm/s MV A velocity: 87.40 cm/s  SHUNTS MV E/A ratio:  0.75  Systemic VTI:  0.22 m Systemic Diam: 2.00 cm  Jenne Campus MD Electronically signed by Jenne Campus MD Signature Date/Time: 08/15/2021/1:07:03 PM    Final    MONITORS  LONG TERM MONITOR (3-14 DAYS) 06/11/2021  Narrative Patch Wear Time:  9 days and 12 hours (2022-06-11T13:43:54-0400 to 2022-07-08T07:12:33-0400)  Monitor 1 Patient had a min HR of 53 bpm, max HR of 109 bpm, and avg HR of 77 bpm. Predominant underlying rhythm was Sinus Rhythm. First Degree AV Block was present. Isolated SVEs were rare (<1.0%), SVE Couplets were rare (<1.0%), and no SVE Triplets were present. Isolated VEs were rare (<1.0%), and no VE Couplets or VE Triplets were present.  Monitor 2 Patient had a min HR of 57 bpm, max HR of 122 bpm, and avg HR of 75 bpm. Predominant underlying rhythm was Sinus Rhythm. First Degree AV Block was present. 4  Supraventricular Tachycardia runs occurred, the run with the fastest interval lasting 6 beats with a max rate of 118 bpm (avg 106 bpm); the run with the fastest interval was also the longest. Atrial Fibrillation occurred (4% burden), ranging from 60-122 bpm (avg of 87 bpm), the longest lasting 31 mins 34 secs with an avg rate of 94 bpm. 1 Pause occurred lasting 3.4 secs (18 bpm). Isolated SVEs were rare (<1.0%), SVE Couplets were rare (<1.0%), and SVE Triplets were rare (<1.0%). Isolated VEs were rare (<1.0%), VE Couplets were rare (<1.0%), and no VE Triplets were present.  There were no triggered or diary events. Ventricular ectopy was rare with isolated PVCs. Supraventricular ectopy was occasional 3.2% no episodes of atrial fibrillation or flutter. Mobitz 1 second-degree AV block heart block is seen with the longest pause 3.1 seconds.           EKG:  EKG ordered today and personally reviewed.  The ekg ordered today demonstrates sinus rhythm no sign of 1C antiarrhythmic drug toxicity she has first-degree AV block left axis deviation and a pattern of septal infarction versus lead placement.    Physical Exam:    VS:  BP 128/72 (BP Location: Left Arm, Patient Position: Sitting)   Pulse 79   Ht '4\' 11"'$  (1.499 m)   Wt 173 lb 3.2 oz (78.6 kg)   BMI 34.98 kg/m     Wt Readings from Last 3 Encounters:  02/01/23 173 lb 3.2 oz (78.6 kg)  05/10/22 170 lb 3.2 oz (77.2 kg)  05/01/22 170 lb 3.2 oz (77.2 kg)     GEN:  Well nourished, well developed in no acute distress HEENT: Normal NECK: No JVD; No carotid bruits LYMPHATICS: No lymphadenopathy CARDIAC: RRR, no murmurs, rubs, gallops RESPIRATORY:  Clear to auscultation without rales, wheezing or rhonchi  ABDOMEN: Soft, non-tender, non-distended MUSCULOSKELETAL:  No edema; No deformity  SKIN: Warm and dry NEUROLOGIC:  Alert and oriented x 3 PSYCHIATRIC:  Normal affect    Signed, Shirlee More, MD  02/01/2023 1:30 PM    [facility]

## 2023-02-01 ENCOUNTER — Ambulatory Visit: Payer: HMO | Attending: Cardiology | Admitting: Cardiology

## 2023-02-01 ENCOUNTER — Encounter: Payer: Self-pay | Admitting: Cardiology

## 2023-02-01 VITALS — BP 128/72 | HR 79 | Ht 59.0 in | Wt 173.2 lb

## 2023-02-01 DIAGNOSIS — R931 Abnormal findings on diagnostic imaging of heart and coronary circulation: Secondary | ICD-10-CM | POA: Diagnosis not present

## 2023-02-01 DIAGNOSIS — I480 Paroxysmal atrial fibrillation: Secondary | ICD-10-CM

## 2023-02-01 DIAGNOSIS — I483 Typical atrial flutter: Secondary | ICD-10-CM | POA: Diagnosis not present

## 2023-02-01 DIAGNOSIS — Z79899 Other long term (current) drug therapy: Secondary | ICD-10-CM

## 2023-02-01 DIAGNOSIS — E782 Mixed hyperlipidemia: Secondary | ICD-10-CM

## 2023-02-01 DIAGNOSIS — Z7901 Long term (current) use of anticoagulants: Secondary | ICD-10-CM | POA: Diagnosis not present

## 2023-02-01 NOTE — Patient Instructions (Signed)
Medication Instructions:  Your physician recommends that you continue on your current medications as directed. Please refer to the Current Medication list given to you today.  *If you need a refill on your cardiac medications before your next appointment, please call your pharmacy*   Lab Work: None If you have labs (blood work) drawn today and your tests are completely normal, you will receive your results only by: MyChart Message (if you have MyChart) OR A paper copy in the mail If you have any lab test that is abnormal or we need to change your treatment, we will call you to review the results.   Testing/Procedures: None   Follow-Up: At [facility], you and your health needs are our priority.  As part of our continuing mission to provide you with exceptional heart care, we have created designated Provider Care Teams.  These Care Teams include your primary Cardiologist (physician) and Advanced Practice Providers (APPs -  Physician Assistants and Nurse Practitioners) who all work together to provide you with the care you need, when you need it.  We recommend signing up for the patient portal called "MyChart".  Sign up information is provided on this After Visit Summary.  MyChart is used to connect with patients for Virtual Visits (Telemedicine).  Patients are able to view lab/test results, encounter notes, upcoming appointments, etc.  Non-urgent messages can be sent to your provider as well.   To learn more about what you can do with MyChart, go to [email or web address].    Your next appointment:   6 month(s)  Provider:   Brian Munley, MD    Other Instructions None  

## 2023-02-06 ENCOUNTER — Other Ambulatory Visit: Payer: Self-pay | Admitting: Cardiology

## 2023-02-06 DIAGNOSIS — I480 Paroxysmal atrial fibrillation: Secondary | ICD-10-CM

## 2023-02-06 NOTE — Telephone Encounter (Signed)
Pt last saw Dr Bettina Gavia 02/01/23, last labs 03/01/22 Creat 0.9, age 72, weight 78.6kg, based on specified criteria pt is on appropriate dosage of Eliquis '5mg'$  BID for afib.  Will refill rx.

## 2023-02-07 ENCOUNTER — Other Ambulatory Visit: Payer: Self-pay | Admitting: Cardiology

## 2023-02-07 DIAGNOSIS — I480 Paroxysmal atrial fibrillation: Secondary | ICD-10-CM

## 2023-02-07 NOTE — Telephone Encounter (Signed)
Refill already sent to pharmacy,

## 2023-02-24 DIAGNOSIS — E1169 Type 2 diabetes mellitus with other specified complication: Secondary | ICD-10-CM | POA: Diagnosis not present

## 2023-02-24 DIAGNOSIS — E782 Mixed hyperlipidemia: Secondary | ICD-10-CM | POA: Diagnosis not present

## 2023-03-07 ENCOUNTER — Other Ambulatory Visit: Payer: Self-pay | Admitting: Cardiology

## 2023-03-18 ENCOUNTER — Telehealth: Payer: Self-pay

## 2023-03-18 DIAGNOSIS — R195 Other fecal abnormalities: Secondary | ICD-10-CM | POA: Diagnosis not present

## 2023-03-18 DIAGNOSIS — K219 Gastro-esophageal reflux disease without esophagitis: Secondary | ICD-10-CM | POA: Diagnosis not present

## 2023-03-18 DIAGNOSIS — E119 Type 2 diabetes mellitus without complications: Secondary | ICD-10-CM | POA: Diagnosis not present

## 2023-03-18 NOTE — Telephone Encounter (Signed)
   Pre-operative Risk Assessment    Patient Name: Cathy Walker  DOB: 30-Oct-1951 MRN: [id number]      Request for Surgical Clearance    Procedure:   Colonoscopy  Date of Surgery:  Clearance 05/03/2023                                 Surgeon:  Jodell Cipro Surgeon's Group or Practice Name:  [facility] Phone number:  [phone] Fax number:  [phone]   Type of Clearance Requested:   - Pharmacy:  Hold Apixaban (Eliquis) Eliquis   Type of Anesthesia:  Not Indicated   Additional requests/questions:    Kathie Dike   03/18/2023, 5:05 PM

## 2023-03-19 NOTE — Telephone Encounter (Signed)
   Primary Cardiologist: Norman Herrlich, MD  Chart reviewed as part of pre-operative protocol coverage. Given past medical history and time since last visit, based on ACC/AHA guidelines, Cathy Walker would be at acceptable risk for the planned procedure without further cardiovascular testing.   Patient was advised that if she develops new symptoms prior to surgery to contact our office to arrange a follow-up appointment. She verbalized understanding.  Per office protocol, patient can hold Eliquis for 2 days prior to procedure.     I will route this recommendation to the requesting party via Epic fax function and remove from pre-op pool.  Please call with questions.  Levi Aland, NP-C  03/19/2023, 12:20 PM [address] Office [phone] Fax [phone]

## 2023-03-19 NOTE — Telephone Encounter (Signed)
Patient with diagnosis of A Fib on Eliquis for anticoagulation.    Procedure: colonoscopy Date of procedure: 05/03/23   CHA2DS2-VASc Score = 5  This indicates a 7.2% annual risk of stroke. The patient's score is based upon: CHF History: 0 HTN History: 1 Diabetes History: 1 Stroke History: 0 Vascular Disease History: 1 Age Score: 1 Gender Score: 1   CrCl 69 mL/min Platelet count 297K   Per office protocol, patient can hold Eliquis for 2 days prior to procedure.     **This guidance is not considered finalized until pre-operative APP has relayed final recommendations.**

## 2023-03-19 NOTE — Telephone Encounter (Signed)
Dr. Dulce Sellar, can you please comment on cardiac risk for upcoming colonoscopy. The patient will be advised that per office protocol, she may hold Eliquis for 2 days prior to procedure.  Please send your response to p cv div preop.  Thank you, Marcelino Duster

## 2023-03-26 DIAGNOSIS — N1831 Chronic kidney disease, stage 3a: Secondary | ICD-10-CM | POA: Diagnosis not present

## 2023-03-26 DIAGNOSIS — E1169 Type 2 diabetes mellitus with other specified complication: Secondary | ICD-10-CM | POA: Diagnosis not present

## 2023-03-26 DIAGNOSIS — E782 Mixed hyperlipidemia: Secondary | ICD-10-CM | POA: Diagnosis not present

## 2023-03-26 DIAGNOSIS — K219 Gastro-esophageal reflux disease without esophagitis: Secondary | ICD-10-CM | POA: Diagnosis not present

## 2023-04-19 ENCOUNTER — Other Ambulatory Visit: Payer: Self-pay | Admitting: Cardiology

## 2023-05-03 DIAGNOSIS — D126 Benign neoplasm of colon, unspecified: Secondary | ICD-10-CM | POA: Diagnosis not present

## 2023-05-03 DIAGNOSIS — K5730 Diverticulosis of large intestine without perforation or abscess without bleeding: Secondary | ICD-10-CM | POA: Diagnosis not present

## 2023-05-03 DIAGNOSIS — R195 Other fecal abnormalities: Secondary | ICD-10-CM | POA: Diagnosis not present

## 2023-05-03 DIAGNOSIS — Z7985 Long-term (current) use of injectable non-insulin antidiabetic drugs: Secondary | ICD-10-CM | POA: Diagnosis not present

## 2023-05-03 DIAGNOSIS — J45909 Unspecified asthma, uncomplicated: Secondary | ICD-10-CM | POA: Diagnosis not present

## 2023-05-03 DIAGNOSIS — Z7984 Long term (current) use of oral hypoglycemic drugs: Secondary | ICD-10-CM | POA: Diagnosis not present

## 2023-05-03 DIAGNOSIS — K644 Residual hemorrhoidal skin tags: Secondary | ICD-10-CM | POA: Diagnosis not present

## 2023-05-03 DIAGNOSIS — K635 Polyp of colon: Secondary | ICD-10-CM | POA: Diagnosis not present

## 2023-05-03 DIAGNOSIS — Z1211 Encounter for screening for malignant neoplasm of colon: Secondary | ICD-10-CM | POA: Diagnosis not present

## 2023-05-03 DIAGNOSIS — Z7951 Long term (current) use of inhaled steroids: Secondary | ICD-10-CM | POA: Diagnosis not present

## 2023-05-03 DIAGNOSIS — F17210 Nicotine dependence, cigarettes, uncomplicated: Secondary | ICD-10-CM | POA: Diagnosis not present

## 2023-05-03 DIAGNOSIS — I10 Essential (primary) hypertension: Secondary | ICD-10-CM | POA: Diagnosis not present

## 2023-05-03 DIAGNOSIS — D122 Benign neoplasm of ascending colon: Secondary | ICD-10-CM | POA: Diagnosis not present

## 2023-05-03 DIAGNOSIS — K648 Other hemorrhoids: Secondary | ICD-10-CM | POA: Diagnosis not present

## 2023-05-03 DIAGNOSIS — E119 Type 2 diabetes mellitus without complications: Secondary | ICD-10-CM | POA: Diagnosis not present

## 2023-05-13 DIAGNOSIS — E1169 Type 2 diabetes mellitus with other specified complication: Secondary | ICD-10-CM | POA: Diagnosis not present

## 2023-05-13 DIAGNOSIS — E785 Hyperlipidemia, unspecified: Secondary | ICD-10-CM | POA: Diagnosis not present

## 2023-05-15 DIAGNOSIS — I10 Essential (primary) hypertension: Secondary | ICD-10-CM | POA: Diagnosis not present

## 2023-05-15 DIAGNOSIS — E782 Mixed hyperlipidemia: Secondary | ICD-10-CM | POA: Diagnosis not present

## 2023-05-15 DIAGNOSIS — G4700 Insomnia, unspecified: Secondary | ICD-10-CM | POA: Diagnosis not present

## 2023-05-15 DIAGNOSIS — Z9181 History of falling: Secondary | ICD-10-CM | POA: Diagnosis not present

## 2023-05-15 DIAGNOSIS — E785 Hyperlipidemia, unspecified: Secondary | ICD-10-CM | POA: Diagnosis not present

## 2023-05-15 DIAGNOSIS — Z0000 Encounter for general adult medical examination without abnormal findings: Secondary | ICD-10-CM | POA: Diagnosis not present

## 2023-05-15 DIAGNOSIS — J310 Chronic rhinitis: Secondary | ICD-10-CM | POA: Diagnosis not present

## 2023-05-15 DIAGNOSIS — Z1331 Encounter for screening for depression: Secondary | ICD-10-CM | POA: Diagnosis not present

## 2023-05-15 DIAGNOSIS — Z6833 Body mass index (BMI) 33.0-33.9, adult: Secondary | ICD-10-CM | POA: Diagnosis not present

## 2023-05-15 DIAGNOSIS — E1169 Type 2 diabetes mellitus with other specified complication: Secondary | ICD-10-CM | POA: Diagnosis not present

## 2023-05-15 DIAGNOSIS — Z1339 Encounter for screening examination for other mental health and behavioral disorders: Secondary | ICD-10-CM | POA: Diagnosis not present

## 2023-07-04 ENCOUNTER — Telehealth: Payer: Self-pay | Admitting: Cardiology

## 2023-07-04 NOTE — Telephone Encounter (Signed)
Called patient and she reported that she had a broke a tooth and needs to have it extracted. She still is in the process of finding a dentist to have this procedure done and she was asking how long she would need to hold her blood thinner. I explained that when she found a dentist that was going to do her procedure she needs to have them send a surgical clearance form to the office and then we can have the pre-op pool address the blood thinner question at that time. Patient was agreeable with that plan and had no further questions at this time.

## 2023-07-04 NOTE — Telephone Encounter (Signed)
Patient states she broke a tooth and she assumes she will need to have it extracted. Her dentist did not ask to check with cardiology but she would like to know if she needs to hold her blood thinner or any other medications. Please advise.

## 2023-07-22 ENCOUNTER — Other Ambulatory Visit: Payer: Self-pay

## 2023-07-22 DIAGNOSIS — I480 Paroxysmal atrial fibrillation: Secondary | ICD-10-CM

## 2023-07-22 MED ORDER — ELIQUIS 5 MG PO TABS: 5.0000 mg | ORAL_TABLET | Freq: Two times a day (BID) | ORAL | 1 refills | Status: AC

## 2023-07-22 NOTE — Telephone Encounter (Signed)
Prescription refill request for Eliquis received. Indication: PAF Last office visit: 02/01/23  Caryl Pina MD Scr: 0.80 on 05/13/23  KPN Age: 72 Weight: 78.6kg  Based on above findings Eliquis 5mg  twice daily is the appropriate dose.  Refill approved.

## 2023-07-30 ENCOUNTER — Telehealth: Payer: Self-pay | Admitting: Cardiology

## 2023-07-30 NOTE — Telephone Encounter (Signed)
Pt wants to know if she can drink wine if she is taking Eliquis. Please advise

## 2023-07-30 NOTE — Telephone Encounter (Signed)
Patient is returning call and is requesting return call.

## 2023-07-30 NOTE — Telephone Encounter (Signed)
Occasional glass of wine is ok. General recommendation for women is no more than 1 drink a day. Higher alcohol intake can thin the blood which could increase risk of bruising/bleeding since she's already on Eliquis.

## 2023-07-30 NOTE — Telephone Encounter (Signed)
Left the pt a message to call the office back. 

## 2023-07-30 NOTE — Telephone Encounter (Signed)
Recommendations reviewed with pt as per Margaretmary Dys RPH-CPP's note.  Pt verbalized understanding and had no additional questions.

## 2023-10-15 ENCOUNTER — Other Ambulatory Visit: Payer: Self-pay

## 2023-10-15 MED ORDER — FLECAINIDE ACETATE 50 MG PO TABS: 50.0000 mg | ORAL_TABLET | Freq: Two times a day (BID) | ORAL | 1 refills | Status: AC

## 2024-01-07 IMAGING — CT CT HEART MORPH/PULM VEIN W/ CM & W/O CA SCORE
2 of 6 series · 12 of 20 positions shown, 14 images · IV contrast (Omni 300)
Comparison: 08/25/2021 CTA chest from [REDACTED]
COMPARISON: 08/25/2021 CTA chest from [REDACTED]

Addendum:
EXAM:
OVER-READ INTERPRETATION  CT CHEST

The following report is an over-read performed by radiologist Dr.
Kaichiu Rezeki [REDACTED] on 12/08/2021. This over-read
does not include interpretation of cardiac or coronary anatomy or
pathology. The coronary CTA interpretation by the cardiologist is
attached.
CLINICAL DATA: Atrial fibrillation scheduled for ablation.
Cardiac CTA
TECHNIQUE: A non-contrast, gated CT scan was obtained with axial slices of 3 mm
through the heart for calcium scoring. Calcium scoring was performed
using the Agatston method. A 120 kV retrospective, gated, contrast
cardiac scan was obtained. Gantry rotation speed was 250 msecs and
collimation was 0.6 mm. Nitroglycerin was not given. A delayed scan
was obtained to exclude left atrial appendage thrombus. The 3D
dataset was reconstructed in 5% intervals of the 0-95% of the R-R
cycle. Late systolic phases were analyzed on a dedicated workstation
using MPR, MIP, and VRT modes. The patient received 80 cc of
contrast.

[Series 8: 0-90% · axial · 0.39mm/px · z∈[+1192,+1302]mm · 6 of 3100 slices shown, 8 images]
[im 443/3100  vessel]
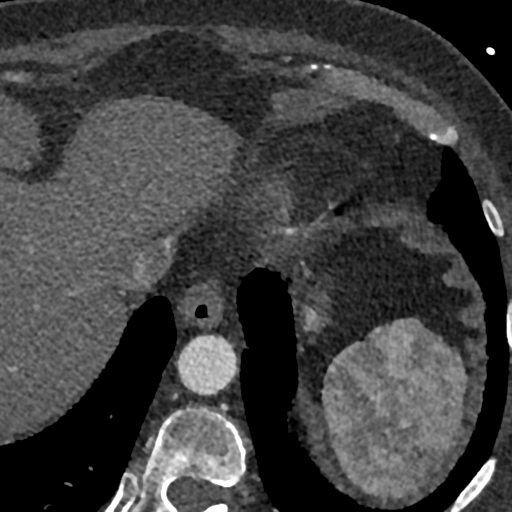
[im 443/3100  lung]
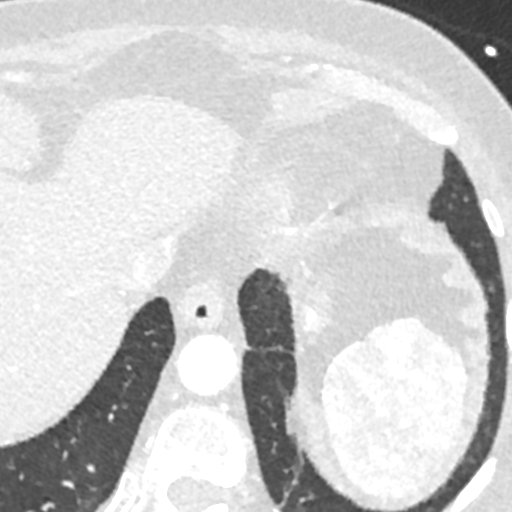
[im 886/3100  vessel]
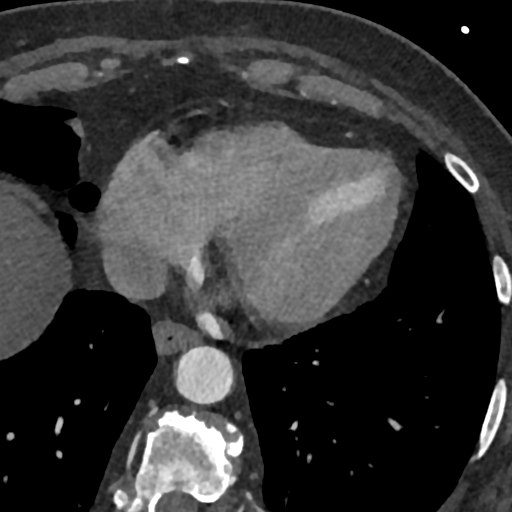
[im 1329/3100  vessel]
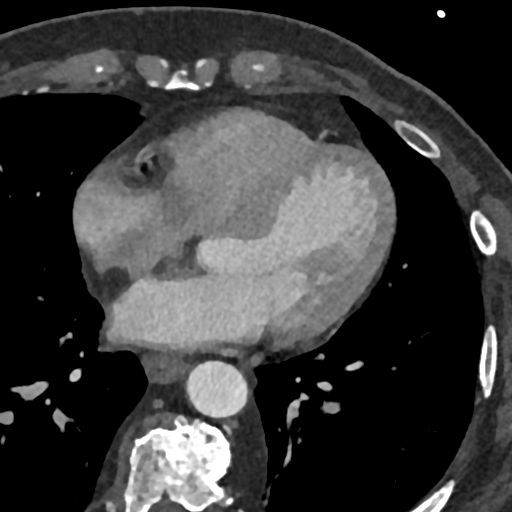
[im 1771/3100  vessel]
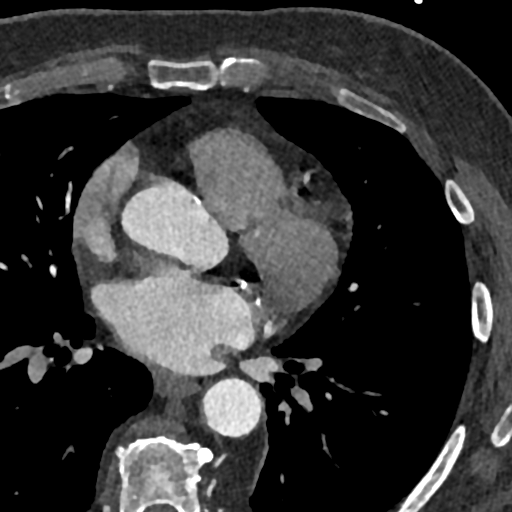
[im 2214/3100  vessel]
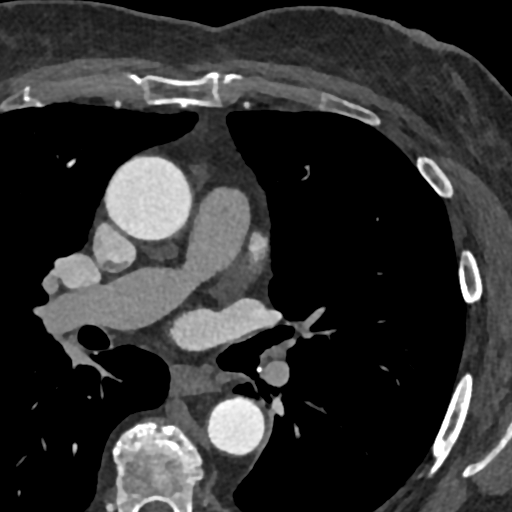
[im 2214/3100  lung]
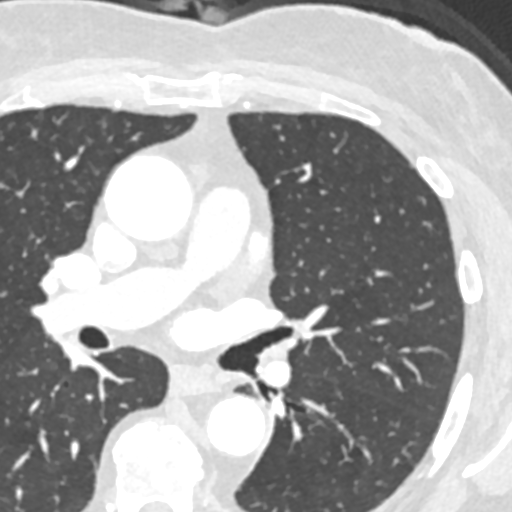
[im 2657/3100  vessel]
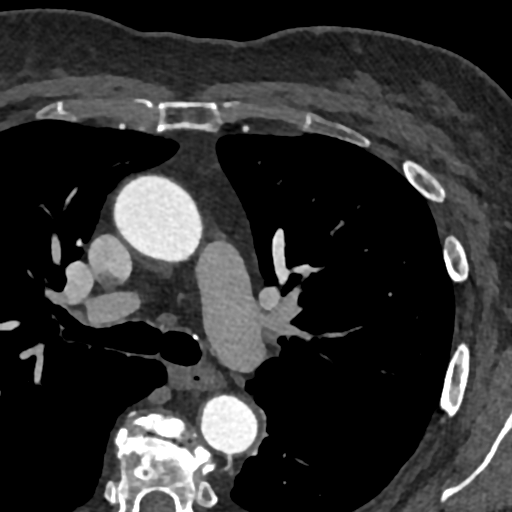

[Series 13: 5-95% · axial · 0.72mm/px · z∈[+1192,+1302]mm · 6 of 3100 slices shown]
[im 443/3100  vessel]
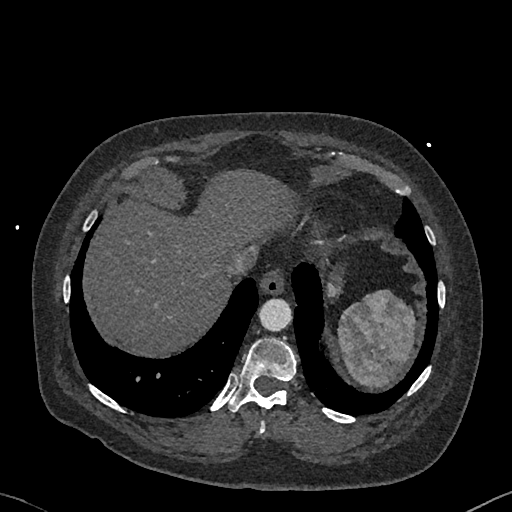
[im 886/3100  vessel]
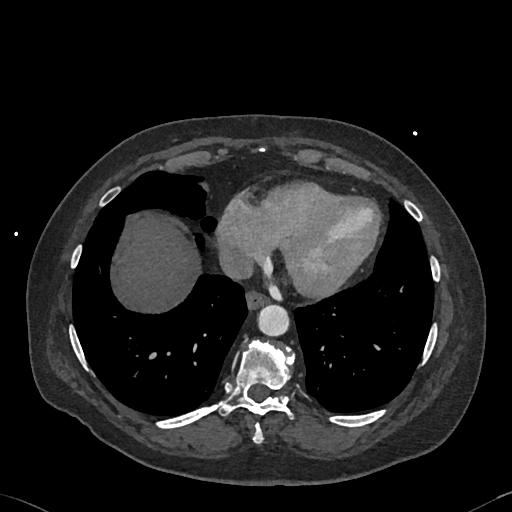
[im 1329/3100  vessel]
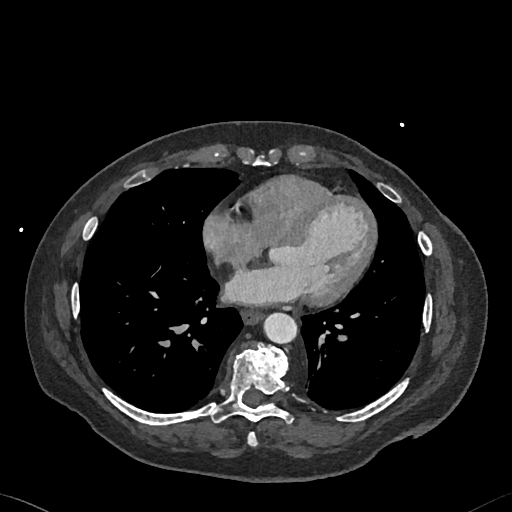
[im 1771/3100  vessel]
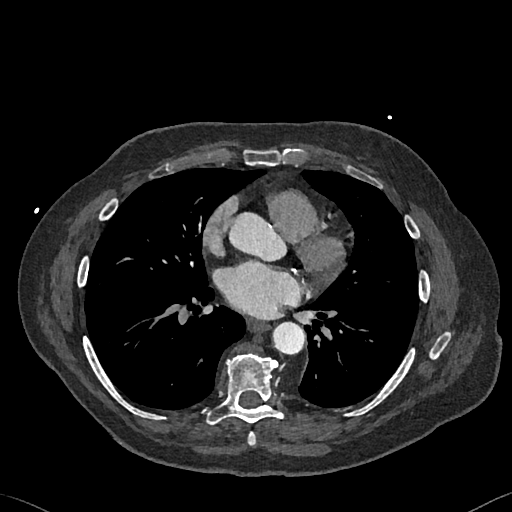
[im 2214/3100  vessel]
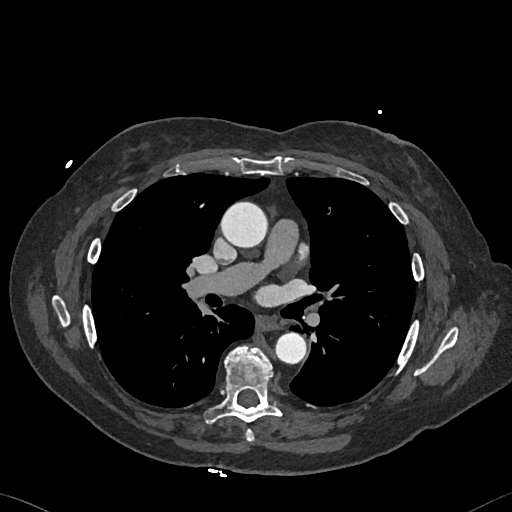
[im 2657/3100  vessel]
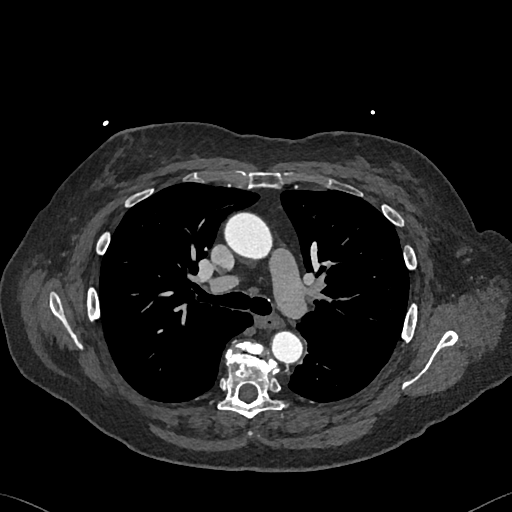

[12 of 20 positions shown; findings below may reference images not displayed]

FINDINGS: Vascular: Aortic atherosclerosis. Tortuous thoracic aorta. No
central pulmonary embolism, on this non-dedicated study.

Mediastinum/Nodes: No imaged thoracic adenopathy.

Lungs/Pleura: No pleural fluid. Incompletely imaged right lower lobe
AVM at 1.4 cm on 50/10, similar to on the prior.

Bibasilar scarring.  Posterior left upper lobe calcified granuloma.

Upper Abdomen: Normal imaged portions of the liver, spleen. Apparent
gastric wall thickening is favored to be due to underdistention.

Musculoskeletal: No acute osseous abnormality. Advanced thoracic
spondylosis.
IMPRESSION: 1.  No acute findings in the imaged extracardiac chest.
2.  Aortic Atherosclerosis (40MAX-3TV.V).
3. Right lower lobe 1.4 cm AVM, similar to on the prior exam.
FINDINGS: Image quality: Average.

Noise artifact is: Limited.

Pulmonary Veins: There is normal pulmonary vein drainage into the
left atrium (2 on the right and 2 on the left) with ostial
measurements as follows:

RUPV: Ostium 7.97 mm x 7.37 mm  area 0.423 cm2

RLPV:  Ostium 16.4 mm x 12.6 mm  area 1.58 cm 2

LUPV:  Ostium 17.1 mm x 15.2 mm area 1.90 cm2

LLPV:  Ostium 15.1 mm x 12.9 mm  area 1.47 cm2

Left Atrium: The left atrial size is normal. There is no PFO/ASD.
There is no thrombus in the left atrial appendage on contrast or
delayed imaging. The esophagus runs in the left atrial midline and
is in close proximity to the left lower pulmonary vein ostia.

Coronary Arteries: CAC score of 385, which is 86 percentile for
age-, race-, and sex-matched controls. Normal coronary origin. Right
dominance. The study was performed without use of NTG and is
insufficient for plaque evaluation.

Right Atrium: Right atrial size is within normal limits.

Right Ventricle: The right ventricular cavity is within normal
limits.

Left Ventricle: The ventricular cavity size is within normal limits.
There are no stigmata of prior infarction. There is no abnormal
filling defect.

Pericardium: Normal thickness with no significant effusion or
calcium present.

Pulmonary Artery: Normal caliber without proximal filling defect.

Cardiac valves: The aortic valve is trileaflet with minimal
calcification. The mitral valve is normal structure without
significant calcification.

Aorta: Normal caliber with aortic atherosclerosis noted.

Extra-cardiac findings: See attached radiology report for
non-cardiac structures.
IMPRESSION: 1. There is normal pulmonary vein drainage into the left atrium with
ostial measurements above.

2. There is no thrombus in the left atrial appendage.

3. The esophagus runs in the left atrial midline and is in close
proximity to the left lower pulmonary vein ostia.

4. No PFO/ASD.

5. Normal coronary origin. Right dominance.

6. CAC score of 385 which is 86 percentile for age-, race-, and
sex-matched controls.

7. Aortic atherosclerosis

*** End of Addendum ***
EXAM:
OVER-READ INTERPRETATION  CT CHEST

The following report is an over-read performed by radiologist Dr.
Kynga [REDACTED] on 12/08/2021. This over-read
does not include interpretation of cardiac or coronary anatomy or
pathology. The coronary CTA interpretation by the cardiologist is
attached.
FINDINGS: Vascular: Aortic atherosclerosis. Tortuous thoracic aorta. No
central pulmonary embolism, on this non-dedicated study.

Mediastinum/Nodes: No imaged thoracic adenopathy.

Lungs/Pleura: No pleural fluid. Incompletely imaged right lower lobe
AVM at 1.4 cm on 50/10, similar to on the prior.

Bibasilar scarring.  Posterior left upper lobe calcified granuloma.

Upper Abdomen: Normal imaged portions of the liver, spleen. Apparent
gastric wall thickening is favored to be due to underdistention.

Musculoskeletal: No acute osseous abnormality. Advanced thoracic
spondylosis.
IMPRESSION: 1.  No acute findings in the imaged extracardiac chest.
2.  Aortic Atherosclerosis (40MAX-3TV.V).
3. Right lower lobe 1.4 cm AVM, similar to on the prior exam.

## 2024-02-18 ENCOUNTER — Telehealth: Payer: Self-pay | Admitting: Cardiology

## 2024-02-18 NOTE — Telephone Encounter (Signed)
 Patient has moved to [facility] and would like to know if Dr Dulce Sellar can recommend anyone in the [facility] network for Cardiology in the [place] or [place] area as we are too far to drive to now. Please call patient at [phone] with recommendation. Patient is aware Dr Dulce Sellar is out of office until the end of the month.  Thank you

## 2024-03-30 ENCOUNTER — Telehealth: Payer: Self-pay | Admitting: Cardiology

## 2024-03-30 NOTE — Telephone Encounter (Signed)
 Patient is requesting to have a referral/records sent to new cardiologist, Dr. Alexandro Angelica of [facility]. Please contact patient with any updates.  phone#: [phone]  fax#: [phone]
